# Patient Record
Sex: Male | Born: 1960 | Race: Black or African American | Hispanic: No | Marital: Single | Smoking: Current every day smoker
Health system: Southern US, Community
[De-identification: ages and names within clinical notes are randomized; demographics above are authoritative.]

## PROBLEM LIST (undated history)

## (undated) DIAGNOSIS — N289 Disorder of kidney and ureter, unspecified: Secondary | ICD-10-CM

## (undated) DIAGNOSIS — E119 Type 2 diabetes mellitus without complications: Secondary | ICD-10-CM

## (undated) DIAGNOSIS — J9690 Respiratory failure, unspecified, unspecified whether with hypoxia or hypercapnia: Secondary | ICD-10-CM

## (undated) DIAGNOSIS — E1340 Other specified diabetes mellitus with diabetic neuropathy, unspecified: Secondary | ICD-10-CM

## (undated) DIAGNOSIS — J189 Pneumonia, unspecified organism: Secondary | ICD-10-CM

## (undated) HISTORY — PX: SPINE SURGERY: SHX786

## (undated) HISTORY — DX: Other specified diabetes mellitus with diabetic neuropathy, unspecified: E13.40

---

## 2002-08-14 ENCOUNTER — Encounter: Payer: Self-pay | Admitting: Emergency Medicine

## 2002-08-14 ENCOUNTER — Emergency Department (HOSPITAL_COMMUNITY): Admission: EM | Admit: 2002-08-14 | Discharge: 2002-08-14 | Payer: Self-pay | Admitting: Emergency Medicine

## 2013-09-15 ENCOUNTER — Ambulatory Visit: Payer: Self-pay | Admitting: Internal Medicine

## 2013-09-20 ENCOUNTER — Ambulatory Visit: Payer: Self-pay

## 2013-11-04 ENCOUNTER — Encounter: Payer: Self-pay | Admitting: Internal Medicine

## 2013-11-04 ENCOUNTER — Ambulatory Visit: Payer: Self-pay | Attending: Internal Medicine | Admitting: Internal Medicine

## 2013-11-04 VITALS — BP 139/80 | HR 80 | Temp 98.5°F | Resp 16 | Ht 69.0 in | Wt 143.0 lb

## 2013-11-04 DIAGNOSIS — M545 Low back pain, unspecified: Secondary | ICD-10-CM | POA: Insufficient documentation

## 2013-11-04 DIAGNOSIS — M4120 Other idiopathic scoliosis, site unspecified: Secondary | ICD-10-CM | POA: Insufficient documentation

## 2013-11-04 LAB — CBC WITH DIFFERENTIAL/PLATELET
Basophils Absolute: 0 10*3/uL (ref 0.0–0.1)
Basophils Relative: 0 % (ref 0–1)
Eosinophils Absolute: 0.2 10*3/uL (ref 0.0–0.7)
Eosinophils Relative: 3 % (ref 0–5)
HCT: 47.1 % (ref 39.0–52.0)
Hemoglobin: 16 g/dL (ref 13.0–17.0)
Lymphocytes Relative: 25 % (ref 12–46)
Lymphs Abs: 1.5 10*3/uL (ref 0.7–4.0)
MCH: 29.5 pg (ref 26.0–34.0)
MCHC: 34 g/dL (ref 30.0–36.0)
MCV: 86.7 fL (ref 78.0–100.0)
Monocytes Absolute: 0.8 10*3/uL (ref 0.1–1.0)
Monocytes Relative: 13 % — ABNORMAL HIGH (ref 3–12)
Neutro Abs: 3.5 10*3/uL (ref 1.7–7.7)
Neutrophils Relative %: 59 % (ref 43–77)
Platelets: 311 10*3/uL (ref 150–400)
RBC: 5.43 MIL/uL (ref 4.22–5.81)
RDW: 13 % (ref 11.5–15.5)
WBC: 6 10*3/uL (ref 4.0–10.5)

## 2013-11-04 MED ORDER — IBUPROFEN 400 MG PO TABS: 400.0000 mg | ORAL_TABLET | Freq: Four times a day (QID) | ORAL | Status: DC | PRN

## 2013-11-04 MED ORDER — ACETAMINOPHEN-CODEINE #3 300-30 MG PO TABS: 1.0000 | ORAL_TABLET | ORAL | Status: DC | PRN

## 2013-11-04 NOTE — Progress Notes (Signed)
Pt is here to establish care. Pt reports having chronic pain in his lower back, right hip and in between his shoulder blades.

## 2013-11-04 NOTE — Patient Instructions (Signed)
Scoliosis  Scoliosis is the name given to a spine that curves sideways.Scoliosis can cause twisting of your shoulders, hips, chest, back, and rib cage.   CAUSES   The cause of scoliosis is not always known. It may be caused by a birth defect or by a disease that can cause muscular dysfunction and imbalance, such as cerebral palsy and muscular dystrophy.   RISK FACTORS  Having a disease that causes muscle disease or dysfunction.  SIGNS AND SYMPTOMS  Scoliosis often has no signs or symptoms.If they are present, they may include:   Unequal size of one body side compared to the other (asymmetry).   Visible curvature of the spine.   Pain. The pain may limit physical activity.   Shortness of breath.   Bowel or bladder issues.  DIAGNOSIS  A skilled health care provider will perform an evaluation. This will involve:   Taking your history.   Performing a physical examination.   Performing neurological exam to detect nerve or muscle function loss.   Range of motion studies on the spine.   X-rays.  An MRI may also be obtained.  TREATMENT   Treatment varies depending on the nature, extent, and severity of the disease. If the curvature is not great, you may need only observation. A brace may be used to prevent scoliosis from progressing. A brace may also be needed during growth spurts. Physical therapy may be of benefit. Surgery may be required.   HOME CARE INSTRUCTIONS    Your health care provider may suggest exercises to strengthen your muscles. Perform them as directed.   Ask your health care provider before participating in any sports.    If you have been prescribed an orthopedic brace, wear it as instructed by your health care provider.  SEEK MEDICAL CARE IF:  Your brace causes the skin to become sore (chafe) or is uncomfortable.   SEEK IMMEDIATE MEDICAL CARE IF:   You have back pain that is not relieved by the medicines prescribed by your health care provider.    Your legs feel weak or you lose function  in your legs.   You lose some bowel or bladder control.   Document Released: 09/22/2000 Document Revised: 07/16/2013 Document Reviewed: 06/02/2013  [employer] Patient Information 2014 [employer]

## 2013-11-04 NOTE — Progress Notes (Signed)
Patient ID: BACH ROCCHI, male   DOB: Mar 05, 1961, 53 y.o.   MRN: [id number] Patient Demographics  Allen Hernandez, is a 53 y.o. male  WUJ:[id number]  NFA:[id number]  DOB - 1960/12/19  CC:  Chief Complaint  Patient presents with  . Establish Care       HPI: Allen Hernandez is a 53 y.o. male here today to establish medical care. 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 He has no history of hypertension or diabetes. His mother had breast cancer, his father died of pancreatic cancer. He has a brother who has diabetes. He smokes cigarettes about half a pack per day, drinks alcohol occasionally. He is in the process of applying for disability and may soon go to court, his lawyer has advised that he has a primary care physician and because he has no insurance was referred to our clinic. Patient has No headache, No chest pain, No abdominal pain - No Nausea, No new weakness tingling or numbness, No Cough - SOB.  No Known Allergies History reviewed. No pertinent past medical history. No current outpatient prescriptions on file prior to visit.   No current facility-administered medications on file prior to visit.   Family History  Problem Relation Age of Onset  . Cancer Mother   . Cancer Father   . Diabetes Brother    History   Social History  . Marital Status: Single    Spouse Name: N/A    Number of Children: N/A  . Years of Education: N/A   Occupational History  . Not on file.   Social History Main Topics  . Smoking status: Heavy Tobacco Smoker -- 0.50 packs/day    Types: Cigarettes  . Smokeless tobacco: Not on file  . Alcohol Use: 0.5 oz/week    1 drink(s) per week  . Drug Use: No  . Sexual Activity: Yes    Other Topics Concern  . Not on file   Social History Narrative  . No narrative on file    Review of Systems: Constitutional: Negative for fever, chills, diaphoresis, activity change, appetite change and fatigue. HENT: Negative for ear pain, nosebleeds, congestion, facial swelling, rhinorrhea, neck pain, neck stiffness and ear discharge.  Eyes: Negative for pain, discharge, redness, itching and visual disturbance. Respiratory: Negative for cough, choking, chest tightness, shortness of breath, wheezing and stridor.  Cardiovascular: Negative for chest pain, palpitations and leg swelling. Gastrointestinal: Negative for abdominal distention. Genitourinary: Negative for dysuria, urgency, frequency, hematuria, flank pain, decreased urine volume, difficulty urinating and dyspareunia.  Musculoskeletal: Back pain  Neurological: Negative for dizziness, tremors, seizures, syncope, facial asymmetry, speech difficulty, weakness, light-headedness, numbness and headaches.  Hematological: Negative for adenopathy. Does not bruise/bleed easily. Psychiatric/Behavioral: Negative for hallucinations, behavioral problems, confusion, dysphoric mood, decreased concentration and agitation.    Objective:   Filed Vitals:   11/04/13 1149  BP: 139/80  Pulse: 80  Temp: 98.5 F (36.9 C)  Resp: 16    Physical Exam: Constitutional: Patient appears well-developed and well-nourished. No distress. HENT: Normocephalic, atraumatic, External right and left ear normal. Oropharynx is clear and moist.  Eyes: Conjunctivae and EOM are normal. PERRLA, no scleral icterus. Neck: Normal ROM. Neck supple. No JVD. No tracheal deviation. No thyromegaly. CVS: RRR, S1/S2 +, no murmurs,  no gallops, no carotid bruit.  Pulmonary: Effort and breath sounds normal, no stridor, rhonchi, wheezes, rales.  Abdominal: Soft. BS +, no distension, tenderness, rebound or guarding.  Musculoskeletal: Normal range of motion. No edema and no  tenderness. Kyphoscoliosis with healed surgical scar from cervical to sacral spine Lymphadenopathy: No lymphadenopathy noted, cervical, inguinal or axillary Neuro: Alert. Normal reflexes, muscle tone coordination. No cranial nerve deficit. Skin: Skin is warm and dry. No rash noted. Not diaphoretic. No erythema. No pallor. Psychiatric: Normal mood and affect. Behavior, judgment, thought content normal.  No results found for this basename: WBC, HGB, HCT, MCV, PLT   No results found for this basename: CREATININE, BUN, NA, K, CL, CO2    No results found for this basename: HGBA1C   Lipid Panel  No results found for this basename: chol, trig, hdl, cholhdl, vldl, ldlcalc       Assessment and plan:   1. Scoliosis (and kyphoscoliosis), idiopathic Status post surgery  2. Low back pain  - acetaminophen-codeine (TYLENOL #3) 300-30 MG per tablet; Take 1 tablet by mouth every 4 (four) hours as needed for moderate pain.  Dispense: 90 tablet; Refill: 0 - ibuprofen (ADVIL,MOTRIN) 400 MG tablet; Take 1 tablet (400 mg total) by mouth every 6 (six) hours as needed for fever.  Dispense: 60 tablet; Refill: 0 - CBC with Differential - COMPLETE METABOLIC PANEL WITH GFR - Urinalysis, Complete - Lipid panel  Patient was extensively counseled about smoking cessation  Follow up in 3 months or when necessary    The patient was given clear instructions to go to ER or return to medical center if symptoms don't improve, worsen or new problems develop. The patient verbalized understanding. The patient was told to call to get lab results if they haven't heard anything in the next week.     Jeanann LewandowskyJEGEDE, Maricela Kawahara, MD, MHA, Maxwell CaulFACP, FAAP [facility] [place], [place] [phone]   11/04/2013, 12:33 PM

## 2013-11-05 ENCOUNTER — Telehealth: Payer: Self-pay | Admitting: *Deleted

## 2013-11-05 LAB — COMPLETE METABOLIC PANEL WITH GFR
ALT: 15 U/L (ref 0–53)
AST: 13 U/L (ref 0–37)
Albumin: 4.3 g/dL (ref 3.5–5.2)
Alkaline Phosphatase: 71 U/L (ref 39–117)
BUN: 9 mg/dL (ref 6–23)
CO2: 26 mEq/L (ref 19–32)
Calcium: 9.3 mg/dL (ref 8.4–10.5)
Chloride: 101 mEq/L (ref 96–112)
Creat: 0.98 mg/dL (ref 0.50–1.35)
GFR, Est African American: >89 mL/min
GFR, Est Non African American: 89 mL/min
Glucose, Bld: 283 mg/dL — ABNORMAL HIGH (ref 70–99)
Potassium: 4.1 mEq/L (ref 3.5–5.3)
Sodium: 137 mEq/L (ref 135–145)
Total Bilirubin: 0.4 mg/dL (ref 0.3–1.2)
Total Protein: 6.9 g/dL (ref 6.0–8.3)

## 2013-11-05 LAB — LIPID PANEL
Cholesterol: 150 mg/dL (ref 0–200)
HDL: 50 mg/dL (ref >39)
LDL Cholesterol: 87 mg/dL (ref 0–99)
Total CHOL/HDL Ratio: 3 Ratio
Triglycerides: 63 mg/dL (ref <150)
VLDL: 13 mg/dL (ref 0–40)

## 2013-11-05 LAB — URINALYSIS, COMPLETE
Bacteria, UA: NONE SEEN
Casts: NONE SEEN
Crystals: NONE SEEN
Glucose, UA: >1000 mg/dL — AB
Hgb urine dipstick: NEGATIVE
Ketones, ur: NEGATIVE mg/dL
Leukocytes, UA: NEGATIVE
Nitrite: NEGATIVE
Protein, ur: NEGATIVE mg/dL
Specific Gravity, Urine: >1.03 — ABNORMAL HIGH (ref 1.005–1.030)
Squamous Epithelial / LPF: NONE SEEN
Urobilinogen, UA: 1 mg/dL (ref 0.0–1.0)
pH: 5 (ref 5.0–8.0)

## 2013-11-05 NOTE — Telephone Encounter (Signed)
Left message with mother she will have him call me tomorrow.

## 2013-11-05 NOTE — Telephone Encounter (Signed)
Message copied by Raynelle CharyWINFREE, Malory Spurr R on Wed Nov 05, 2013  6:05 PM ------      Message from: Jeanann LewandowskyJEGEDE, OLUGBEMIGA E      Created: Wed Nov 05, 2013  9:32 AM       Please call patient to inform him that his laboratory tests results shows a very high blood sugar level and large quantity of glucose in his urine, this suggests that most likely diabetic. We need to see him in the clinic today for a repeat blood test to confirm these diagnoses and to start medication appropriately. Please inform him that his cholesterol levels are within normal limit ------

## 2013-11-18 ENCOUNTER — Ambulatory Visit: Payer: Self-pay

## 2013-12-12 ENCOUNTER — Ambulatory Visit: Payer: Self-pay

## 2013-12-25 ENCOUNTER — Ambulatory Visit: Payer: Self-pay

## 2014-01-29 ENCOUNTER — Ambulatory Visit: Payer: Self-pay | Attending: Internal Medicine | Admitting: Internal Medicine

## 2014-01-29 VITALS — BP 124/83 | HR 68 | Temp 98.2°F | Resp 16

## 2014-01-29 DIAGNOSIS — G8929 Other chronic pain: Secondary | ICD-10-CM | POA: Insufficient documentation

## 2014-01-29 DIAGNOSIS — M549 Dorsalgia, unspecified: Secondary | ICD-10-CM | POA: Insufficient documentation

## 2014-01-29 DIAGNOSIS — M4120 Other idiopathic scoliosis, site unspecified: Secondary | ICD-10-CM | POA: Insufficient documentation

## 2014-01-29 MED ORDER — OXYCODONE-ACETAMINOPHEN 5-325 MG PO TABS: 1.0000 | ORAL_TABLET | ORAL | Status: DC | PRN

## 2014-01-29 NOTE — Progress Notes (Signed)
Patient here for his chronic back pain Needs a referral to pain management

## 2014-01-29 NOTE — Progress Notes (Signed)
Patient ID: Allen Hernandez, male   DOB: 10/28/1960, 53 y.o.   MRN: [id number]  Please note that patient was checking on referral for pain clinic. Will look into the reasons for delay. I have not seen the actual order for pain clinic referral so I will put this request in today. Patient has chronic scoliosis so we'll need to see pain specialist as soon as possible.  Debbora PrestoMAGICK-[name], MD  [facility] Pager [phone]  If 7PM-7AM, please contact night-coverage [email or web address] Password [email or web address]

## 2014-02-03 ENCOUNTER — Ambulatory Visit: Payer: Self-pay

## 2014-02-12 ENCOUNTER — Ambulatory Visit: Payer: Self-pay

## 2014-03-04 ENCOUNTER — Ambulatory Visit: Payer: Self-pay

## 2014-03-13 ENCOUNTER — Telehealth: Payer: Self-pay | Admitting: Internal Medicine

## 2014-03-13 ENCOUNTER — Telehealth: Payer: Self-pay | Admitting: Emergency Medicine

## 2014-03-13 NOTE — Telephone Encounter (Signed)
Responded to pt message of blood sugar 233 with syncope last night. Pt was seen by EMT at home without treatment Pt states he is feeling much better today. Pt is not diagnosed with Diabetes. Informed pt he needs to come to the clinic Monday for CBG,A1c and blood work. Asher Muir and Dr. Hyman Hopes will see pt as [name], RN

## 2014-03-13 NOTE — Telephone Encounter (Signed)
Pt not feeling well. Passed out last night but did not want to go to ED. Pt has sickle cell anemia, and had a blood sugar level of 233. Please f/u with pt.

## 2014-03-16 ENCOUNTER — Other Ambulatory Visit: Payer: Self-pay

## 2014-03-16 ENCOUNTER — Ambulatory Visit: Payer: Self-pay | Attending: Internal Medicine | Admitting: Internal Medicine

## 2014-03-16 VITALS — BP 119/83 | HR 74 | Temp 98.3°F | Resp 14 | Ht 70.0 in | Wt 140.0 lb

## 2014-03-16 DIAGNOSIS — E119 Type 2 diabetes mellitus without complications: Secondary | ICD-10-CM

## 2014-03-16 DIAGNOSIS — Z131 Encounter for screening for diabetes mellitus: Secondary | ICD-10-CM

## 2014-03-16 LAB — COMPLETE METABOLIC PANEL WITH GFR
ALT: 16 U/L (ref 0–53)
AST: 13 U/L (ref 0–37)
Albumin: 4 g/dL (ref 3.5–5.2)
Alkaline Phosphatase: 73 U/L (ref 39–117)
BILIRUBIN TOTAL: 0.6 mg/dL (ref 0.2–1.2)
BUN: 9 mg/dL (ref 6–23)
CHLORIDE: 104 meq/L (ref 96–112)
CO2: 23 mEq/L (ref 19–32)
CREATININE: 0.85 mg/dL (ref 0.50–1.35)
Calcium: 9.4 mg/dL (ref 8.4–10.5)
GFR, Est African American: >89 mL/min
GLUCOSE: 162 mg/dL — AB (ref 70–99)
POTASSIUM: 4.2 meq/L (ref 3.5–5.3)
Sodium: 137 mEq/L (ref 135–145)
TOTAL PROTEIN: 6.7 g/dL (ref 6.0–8.3)

## 2014-03-16 LAB — GLUCOSE, POCT (MANUAL RESULT ENTRY)
POC Glucose: 282 mg/dl — AB (ref 70–99)
POC Glucose: 234 mg/dl — AB (ref 70–99)

## 2014-03-16 LAB — TSH: TSH: 0.686 u[IU]/mL (ref 0.350–4.500)

## 2014-03-16 LAB — POCT GLYCOSYLATED HEMOGLOBIN (HGB A1C): HEMOGLOBIN A1C: 9.6

## 2014-03-16 MED ORDER — INSULIN ASPART 100 UNIT/ML ~~LOC~~ SOLN
10.0000 [IU] | Freq: Once | SUBCUTANEOUS | Status: AC
  Administered 2014-03-16: 10 [IU] via SUBCUTANEOUS

## 2014-03-16 MED ORDER — METFORMIN HCL 500 MG PO TABS: 500.0000 mg | ORAL_TABLET | Freq: Two times a day (BID) | ORAL | Status: DC

## 2014-03-16 NOTE — Progress Notes (Signed)
Patient ID: Allen Hernandez, male   DOB: 02-13-1961, 53 y.o.   MRN: [id number]   Allen BirchwoodDamon Haston, is a 53 y.o. male  [id number]:[id number]  NFA:[id number]  DOB - 02-13-1961  CC:  Chief Complaint  Patient presents with  . Diabetes check       HPI: Allen Hernandez is a 53 y.o. male here today to establish medical care. Patient was called in to the clinic today after being lost to followup for hyperglycemia and glycosuria. His blood sugar today is very high and his hemoglobin A1c confirms that patient is diabetic. He has positive family history of diabetes. He is not on medication currently, he has no significant complaint. No blurry vision, no headache. Patient has no personal history of hypertension. He smokes cigarettes about half a pack per day and drinks alcohol occasionally.  Patient has No headache, No chest pain, No abdominal pain - No Nausea, No new weakness tingling or numbness, No Cough - SOB.  No Known Allergies No past medical history on file. Current Outpatient Prescriptions on File Prior to Visit  Medication Sig Dispense Refill  . acetaminophen-codeine (TYLENOL #3) 300-30 MG per tablet Take 1 tablet by mouth every 4 (four) hours as needed for moderate pain.  90 tablet  0  . ibuprofen (ADVIL,MOTRIN) 400 MG tablet Take 1 tablet (400 mg total) by mouth every 6 (six) hours as needed for fever.  60 tablet  0  . oxyCODONE-acetaminophen (ROXICET) 5-325 MG per tablet Take 1 tablet by mouth every 4 (four) hours as needed for severe pain.  65 tablet  0   No current facility-administered medications on file prior to visit.   Family History  Problem Relation Age of Onset  . Cancer Mother   . Cancer Father   . Diabetes Brother    History   Social History  . Marital Status: Single    Spouse Name: N/A    Number of Children: N/A  . Years of Education: N/A   Occupational History  . Not on file.   Social History Main Topics  . Smoking status: Heavy Tobacco Smoker -- 0.50 packs/day    Types:  Cigarettes  . Smokeless tobacco: Not on file  . Alcohol Use: 0.5 oz/week    1 drink(s) per week  . Drug Use: No  . Sexual Activity: Yes   Other Topics Concern  . Not on file   Social History Narrative  . No narrative on file    Review of Systems: Constitutional: Negative for fever, chills, diaphoresis, activity change, appetite change and fatigue. HENT: Negative for ear pain, nosebleeds, congestion, facial swelling, rhinorrhea, neck pain, neck stiffness and ear discharge.  Eyes: Negative for pain, discharge, redness, itching and visual disturbance. Respiratory: Negative for cough, choking, chest tightness, shortness of breath, wheezing and stridor.  Cardiovascular: Negative for chest pain, palpitations and leg swelling. Gastrointestinal: Negative for abdominal distention. Genitourinary: Negative for dysuria, urgency, frequency, hematuria, flank pain, decreased urine volume, difficulty urinating and dyspareunia.  Musculoskeletal: Negative for back pain, joint swelling, arthralgia and gait problem. Neurological: Negative for dizziness, tremors, seizures, syncope, facial asymmetry, speech difficulty, weakness, light-headedness, numbness and headaches.  Hematological: Negative for adenopathy. Does not bruise/bleed easily. Psychiatric/Behavioral: Negative for hallucinations, behavioral problems, confusion, dysphoric mood, decreased concentration and agitation.    Objective:   Filed Vitals:   03/16/14 1125  BP: 119/83  Pulse: 74  Temp: 98.3 F (36.8 C)  Resp: 14    Physical Exam: Constitutional: Patient appears well-developed and well-nourished. No  distress. HENT: Normocephalic, atraumatic, External right and left ear normal. Oropharynx is clear and moist.  Eyes: Conjunctivae and EOM are normal. PERRLA, no scleral icterus. Neck: Normal ROM. Neck supple. No JVD. No tracheal deviation. No thyromegaly. CVS: RRR, S1/S2 +, no murmurs, no gallops, no carotid bruit.  Pulmonary: Effort  and breath sounds normal, no stridor, rhonchi, wheezes, rales.  Abdominal: Soft. BS +, no distension, tenderness, rebound or guarding.  Musculoskeletal: Scoliosis. Normal range of motion. No edema and no tenderness.  Lymphadenopathy: No lymphadenopathy noted, cervical, inguinal or axillary Neuro: Alert. Normal reflexes, muscle tone coordination. No cranial nerve deficit. Skin: Skin is warm and dry. No rash noted. Not diaphoretic. No erythema. No pallor. Psychiatric: Normal mood and affect. Behavior, judgment, thought content normal.  Lab Results  Component Value Date   WBC 6.0 11/04/2013   HGB 16.0 11/04/2013   HCT 47.1 11/04/2013   MCV 86.7 11/04/2013   PLT 311 11/04/2013   Lab Results  Component Value Date   CREATININE 0.98 11/04/2013   BUN 9 11/04/2013   NA 137 11/04/2013   K 4.1 11/04/2013   CL 101 11/04/2013   CO2 26 11/04/2013    Lab Results  Component Value Date   HGBA1C 9.6 03/16/2014   Lipid Panel     Component Value Date/Time   CHOL 150 11/04/2013 1235   TRIG 63 11/04/2013 1235   HDL 50 11/04/2013 1235   CHOLHDL 3.0 11/04/2013 1235   VLDL 13 11/04/2013 1235   LDLCALC 87 11/04/2013 1235       Assessment and plan:   1. Newly diagnosed diabetes mellitus  - Glucose (CBG) - POCT A1C was 9.6% today  - Microalbumin/Creatinine Ratio, Urine - Microalbumin, urine - Hemoglobinopathy evaluation - COMPLETE METABOLIC PANEL WITH GFR - TSH  Patient has been provided with diabetic education Patient was extensively counseled on smoking cessation Patient was counseled on nutrition and exercise Ambulatory referral to diabetic class  2. Hyperglycemia  - insulin aspart (novoLOG) injection 10 Units; Inject 0.1 mLs (10 Units total) into the skin once.   Return in about 2 weeks (around 03/30/2014), or if symptoms worsen or fail to improve, for BP Check, Nurse Visit.  The patient was given clear instructions to go to ER or return to medical center if symptoms don't improve, worsen or  new problems develop. The patient verbalized understanding. The patient was told to call to get lab results if they haven't heard anything in the next week.     This note has been created with Education officer, environmental. Any transcriptional errors are unintentional.    Jeanann Lewandowsky, MD, MHA, Maxwell Caul [facility] [place], [place] [phone]   03/16/2014, 1:05 PM

## 2014-03-16 NOTE — Progress Notes (Signed)
Patient here for blood work to test for diabetes. Patient states last week he had syncopal episode. EMS checked his blood sugar and he was 233.

## 2014-03-16 NOTE — Patient Instructions (Addendum)
Diabetes and Exercise Exercising regularly is important. It is not just about losing weight. It has many health benefits, such as:  Improving your overall fitness, flexibility, and endurance.  Increasing your bone density.  Helping with weight control.  Decreasing your body fat.  Increasing your muscle strength.  Reducing stress and tension.  Improving your overall health. People with diabetes who exercise gain additional benefits because exercise:  Reduces appetite.  Improves the body's use of blood sugar (glucose).  Helps lower or control blood glucose.  Decreases blood pressure.  Helps control blood lipids (such as cholesterol and triglycerides).  Improves the body's use of the hormone insulin by:  Increasing the body's insulin sensitivity.  Reducing the body's insulin needs.  Decreases the risk for heart disease because exercising:  Lowers cholesterol and triglycerides levels.  Increases the levels of good cholesterol (such as high-density lipoproteins [HDL]) in the body.  Lowers blood glucose levels. YOUR ACTIVITY PLAN  Choose an activity that you enjoy and set realistic goals. Your health care provider or diabetes educator can help you make an activity plan that works for you. You can break activities into 2 or 3 sessions throughout the day. Doing so is as good as one long session. Exercise ideas include:  Taking the dog for a walk.  Taking the stairs instead of the elevator.  Dancing to your favorite song.  Doing your favorite exercise with a friend. RECOMMENDATIONS FOR EXERCISING WITH TYPE 1 OR TYPE 2 DIABETES   Check your blood glucose before exercising. If blood glucose levels are greater than 240 mg/dL, check for urine ketones. Do not exercise if ketones are present.  Avoid injecting insulin into areas of the body that are going to be exercised. For example, avoid injecting insulin into:  The arms when playing tennis.  The legs when  jogging.  Keep a record of:  Food intake before and after you exercise.  Expected peak times of insulin action.  Blood glucose levels before and after you exercise.  The type and amount of exercise you have done.  Review your records with your health care provider. Your health care provider will help you to develop guidelines for adjusting food intake and insulin amounts before and after exercising.  If you take insulin or oral hypoglycemic agents, watch for signs and symptoms of hypoglycemia. They include:  Dizziness.  Shaking.  Sweating.  Chills.  Confusion.  Drink plenty of water while you exercise to prevent dehydration or heat stroke. Body water is lost during exercise and must be replaced.  Talk to your health care provider before starting an exercise program to make sure it is safe for you. Remember, almost any type of activity is better than none. Document Released: 12/16/2003 Document Revised: 05/28/2013 Document Reviewed: 03/04/2013 ExitCare Patient Information 2014 [employer]. Diabetes Meal Planning Guide The diabetes meal planning guide is a tool to help you plan your meals and snacks. It is important for people with diabetes to manage their blood glucose (sugar) levels. Choosing the right foods and the right amounts throughout your day will help control your blood glucose. Eating right can even help you improve your blood pressure and reach or maintain a healthy weight. CARBOHYDRATE COUNTING MADE EASY When you eat carbohydrates, they turn to sugar. This raises your blood glucose level. Counting carbohydrates can help you control this level so you feel better. When you plan your meals by counting carbohydrates, you can have more flexibility in what you eat and balance your medicine   with your food intake. Carbohydrate counting simply means adding up the total amount of carbohydrate grams in your meals and snacks. Try to eat about the same amount at each meal. Foods  with carbohydrates are listed below. Each portion below is 1 carbohydrate serving or 15 grams of carbohydrates. Ask your dietician how many grams of carbohydrates you should eat at each meal or snack. Grains and Starches  1 slice bread.   English muffin or hotdog/hamburger bun.   cup cold cereal (unsweetened).   cup cooked pasta or rice.   cup starchy vegetables (corn, potatoes, peas, beans, winter squash).  1 tortilla (6 inches).   bagel.  1 waffle or pancake (size of a CD).   cup cooked cereal.  4 to 6 small crackers. *Whole grain is recommended. Fruit  1 cup fresh unsweetened berries, melon, papaya, pineapple.  1 small fresh fruit.   banana or mango.   cup fruit juice (4 oz unsweetened).   cup canned fruit in natural juice or water.  2 tbs dried fruit.  12 to 15 grapes or cherries. Milk and Yogurt  1 cup fat-free or 1% milk.  1 cup soy milk.  6 oz light yogurt with sugar-free sweetener.  6 oz low-fat soy yogurt.  6 oz plain yogurt. Vegetables  1 cup raw or  cup cooked is counted as 0 carbohydrates or a "free" food.  If you eat 3 or more servings at 1 meal, count them as 1 carbohydrate serving. Other Carbohydrates   oz chips or pretzels.   cup ice cream or frozen yogurt.   cup sherbet or sorbet.  2 inch square cake, no frosting.  1 tbs honey, sugar, jam, jelly, or syrup.  2 small cookies.  3 squares of graham crackers.  3 cups popcorn.  6 crackers.  1 cup broth-based soup.  Count 1 cup casserole or other mixed foods as 2 carbohydrate servings.  Foods with less than 20 calories in a serving may be counted as 0 carbohydrates or a "free" food. You may want to purchase a book or computer software that lists the carbohydrate gram counts of different foods. In addition, the nutrition facts panel on the labels of the foods you eat are a good source of this information. The label will tell you how big the serving size is and the  total number of carbohydrate grams you will be eating per serving. Divide this number by 15 to obtain the number of carbohydrate servings in a portion. Remember, 1 carbohydrate serving equals 15 grams of carbohydrate. SERVING SIZES Measuring foods and serving sizes helps you make sure you are getting the right amount of food. The list below tells how big or small some common serving sizes are.  1 oz.........4 stacked dice.  3 oz.........Deck of cards.  1 tsp........Tip of little finger.  1 tbs........Thumb.  2 tbs........Golf ball.   cup.......Half of a fist.  1 cup........A fist. SAMPLE DIABETES MEAL PLAN Below is a sample meal plan that includes foods from the grain and starches, dairy, vegetable, fruit, and meat groups. A dietician can individualize a meal plan to fit your calorie needs and tell you the number of servings needed from each food group. However, controlling the total amount of carbohydrates in your meal or snack is more important than making sure you include all of the food groups at every meal. You may interchange carbohydrate containing foods (dairy, starches, and fruits). The meal plan below is an example of a 2000 calorie diet   using carbohydrate counting. This meal plan has 17 carbohydrate servings. Breakfast  1 cup oatmeal (2 carb servings).   cup light yogurt (1 carb serving).  1 cup blueberries (1 carb serving).   cup almonds. Snack  1 large apple (2 carb servings).  1 low-fat string cheese stick. Lunch  Chicken breast salad.  1 cup spinach.   cup chopped tomatoes.  2 oz chicken breast, sliced.  2 tbs low-fat Italian dressing.  12 whole-wheat crackers (2 carb servings).  12 to 15 grapes (1 carb serving).  1 cup low-fat milk (1 carb serving). Snack  1 cup carrots.   cup hummus (1 carb serving). Dinner  3 oz broiled salmon.  1 cup brown rice (3 carb servings). Snack  1  cups steamed broccoli (1 carb serving) drizzled with 1  tsp olive oil and lemon juice.  1 cup light pudding (2 carb servings). DIABETES MEAL PLANNING WORKSHEET Your dietician can use this worksheet to help you decide how many servings of foods and what types of foods are right for you.  BREAKFAST Food Group and Servings / Carb Servings Grain/Starches __________________________________ Dairy __________________________________________ Vegetable ______________________________________ Fruit ___________________________________________ Meat __________________________________________ Fat ____________________________________________ LUNCH Food Group and Servings / Carb Servings Grain/Starches ___________________________________ Dairy ___________________________________________ Fruit ____________________________________________ Meat ___________________________________________ Fat _____________________________________________ DINNER Food Group and Servings / Carb Servings Grain/Starches ___________________________________ Dairy ___________________________________________ Fruit ____________________________________________ Meat ___________________________________________ Fat _____________________________________________ SNACKS Food Group and Servings / Carb Servings Grain/Starches ___________________________________ Dairy ___________________________________________ Vegetable _______________________________________ Fruit ____________________________________________ Meat ___________________________________________ Fat _____________________________________________ DAILY TOTALS Starches _________________________ Vegetable ________________________ Fruit ____________________________ Dairy ____________________________ Meat ____________________________ Fat ______________________________ Document Released: 06/22/2005 Document Revised: 12/18/2011 Document Reviewed: 05/03/2009 ExitCare Patient Information 2014 [employer].  

## 2014-03-17 LAB — MICROALBUMIN / CREATININE URINE RATIO
Creatinine, Urine: 399.4 mg/dL
MICROALB/CREAT RATIO: 2.2 mg/g (ref 0.0–30.0)
Microalb, Ur: 0.88 mg/dL (ref 0.00–1.89)

## 2014-03-18 LAB — HEMOGLOBINOPATHY EVALUATION
HEMOGLOBIN OTHER: 0 %
Hgb A2 Quant: 2.6 % (ref 2.2–3.2)
Hgb A: 97.4 % (ref 96.8–97.8)
Hgb F Quant: 0 % (ref 0.0–2.0)
Hgb S Quant: 0 %

## 2014-03-30 ENCOUNTER — Ambulatory Visit: Payer: Self-pay | Attending: Internal Medicine

## 2014-03-30 NOTE — Progress Notes (Signed)
Pt came in for blood sugar recheck s/p new diagnosis Diabetes on Metformin 500mg  BID Pt never picked up medication in pharmacy. Instructed pt to pick medication up and return in 2 weeks for recheck CBG

## 2014-04-07 ENCOUNTER — Telehealth: Payer: Self-pay | Admitting: Internal Medicine

## 2014-04-07 NOTE — Telephone Encounter (Signed)
Pt calling regarding med refill for pain meds, last appt was in April. Please f/u with pt.

## 2014-04-08 ENCOUNTER — Telehealth: Payer: Self-pay | Admitting: Emergency Medicine

## 2014-04-08 NOTE — Telephone Encounter (Signed)
Pt requesting refill of Tylenol #3. Please f/u

## 2014-04-13 ENCOUNTER — Ambulatory Visit: Payer: Self-pay | Attending: Internal Medicine | Admitting: *Deleted

## 2014-04-13 VITALS — BP 122/83 | HR 81 | Temp 98.5°F | Resp 16

## 2014-04-13 DIAGNOSIS — M545 Low back pain, unspecified: Secondary | ICD-10-CM

## 2014-04-13 DIAGNOSIS — E119 Type 2 diabetes mellitus without complications: Secondary | ICD-10-CM

## 2014-04-13 LAB — GLUCOSE, POCT (MANUAL RESULT ENTRY)
POC GLUCOSE: 310 mg/dL — AB (ref 70–99)
POC GLUCOSE: 329 mg/dL — AB (ref 70–99)

## 2014-04-13 MED ORDER — INSULIN ASPART 100 UNIT/ML ~~LOC~~ SOLN: 20.0000 [IU] | Freq: Once | SUBCUTANEOUS | Status: DC

## 2014-04-13 MED ORDER — ACETAMINOPHEN-CODEINE #3 300-30 MG PO TABS: 1.0000 | ORAL_TABLET | Freq: Three times a day (TID) | ORAL | Status: DC | PRN

## 2014-04-13 NOTE — Patient Instructions (Signed)
Diabetes Mellitus and Food It is important for you to manage your blood sugar (glucose) level. Your blood glucose level can be greatly affected by what you eat. Eating healthier foods in the appropriate amounts throughout the day at about the same time each day will help you control your blood glucose level. It can also help slow or prevent worsening of your diabetes mellitus. Healthy eating may even help you improve the level of your blood pressure and reach or maintain a healthy weight.  HOW CAN FOOD AFFECT ME? Carbohydrates Carbohydrates affect your blood glucose level more than any other type of food. Your dietitian will help you determine how many carbohydrates to eat at each meal and teach you how to count carbohydrates. Counting carbohydrates is important to keep your blood glucose at a healthy level, especially if you are using insulin or taking certain medicines for diabetes mellitus. Alcohol Alcohol can cause sudden decreases in blood glucose (hypoglycemia), especially if you use insulin or take certain medicines for diabetes mellitus. Hypoglycemia can be a life-threatening condition. Symptoms of hypoglycemia (sleepiness, dizziness, and disorientation) are similar to symptoms of having too much alcohol.  If your health care provider has given you approval to drink alcohol, do so in moderation and use the following guidelines:  Women should not have more than one drink per day, and men should not have more than two drinks per day. One drink is equal to:  12 oz of beer.  5 oz of wine.  1 oz of hard liquor.  Do not drink on an empty stomach.  Keep yourself hydrated. Have water, diet soda, or unsweetened iced tea.  Regular soda, juice, and other mixers might contain a lot of carbohydrates and should be counted. WHAT FOODS ARE NOT RECOMMENDED? As you make food choices, it is important to remember that all foods are not the same. Some foods have fewer nutrients per serving than other  foods, even though they might have the same number of calories or carbohydrates. It is difficult to get your body what it needs when you eat foods with fewer nutrients. Examples of foods that you should avoid that are high in calories and carbohydrates but low in nutrients include:  Trans fats (most processed foods list trans fats on the Nutrition Facts label).  Regular soda.  Juice.  Candy.  Sweets, such as cake, pie, doughnuts, and cookies.  Fried foods. WHAT FOODS CAN I EAT? Have nutrient-rich foods, which will nourish your body and keep you healthy. The food you should eat also will depend on several factors, including:  The calories you need.  The medicines you take.  Your weight.  Your blood glucose level.  Your blood pressure level.  Your cholesterol level. You also should eat a variety of foods, including:  Protein, such as meat, poultry, fish, tofu, nuts, and seeds (lean animal proteins are best).  Fruits.  Vegetables.  Dairy products, such as milk, cheese, and yogurt (low fat is best).  Breads, grains, pasta, cereal, rice, and beans.  Fats such as olive oil, trans fat-free margarine, canola oil, avocado, and olives. DOES EVERYONE WITH DIABETES MELLITUS HAVE THE SAME MEAL PLAN? Because every person with diabetes mellitus is different, there is not one meal plan that works for everyone. It is very important that you meet with a dietitian who will help you create a meal plan that is just right for you. Document Released: 06/22/2005 Document Revised: 09/30/2013 Document Reviewed: 08/22/2013 ExitCare Patient Information 2015 [employer]. This   information is not intended to replace advice given to you by your health care provider. Make sure you discuss any questions you have with your health care provider.  

## 2014-04-13 NOTE — Progress Notes (Signed)
Patient here for CBG check. Patient has been taking medication as prescribed but diet is not controlled. Patient CBG 376. 10 units of Novolog administered per protocol. CBG recheck was 310. Patient educated about diet.

## 2014-04-21 ENCOUNTER — Telehealth: Payer: Self-pay | Admitting: Emergency Medicine

## 2014-04-21 ENCOUNTER — Telehealth: Payer: Self-pay | Admitting: Internal Medicine

## 2014-04-21 NOTE — Telephone Encounter (Signed)
Spoke to pt regarding clarification of scheduled office visit. Pt has appt for 05/11/14. No other appt scheduled

## 2014-04-21 NOTE — Telephone Encounter (Signed)
Pt had lab visit on 04/13/14 and says he was told to come back for another visit apart from the f/u appt on 05/11/14.  Please f/u with pt.

## 2014-05-11 ENCOUNTER — Ambulatory Visit: Payer: Self-pay | Admitting: Internal Medicine

## 2014-05-26 ENCOUNTER — Ambulatory Visit: Payer: Self-pay | Admitting: Internal Medicine

## 2014-06-16 ENCOUNTER — Telehealth: Payer: Self-pay | Admitting: Internal Medicine

## 2014-06-16 NOTE — Telephone Encounter (Signed)
Pt. Calling to get a refill on acetaminophen-codeine (TYLENOL #3) 300-30 MG per tablet, pt. Has a Dr.'s appt on 06/22/2014, but would like enough medication to last him until his appt. Please f/u with pt.

## 2014-06-22 ENCOUNTER — Ambulatory Visit: Payer: Self-pay | Admitting: Internal Medicine

## 2014-07-13 ENCOUNTER — Encounter: Payer: Self-pay | Admitting: Internal Medicine

## 2014-07-13 ENCOUNTER — Ambulatory Visit: Payer: Medicaid Other | Attending: Internal Medicine | Admitting: Internal Medicine

## 2014-07-13 VITALS — BP 106/68 | HR 83 | Temp 98.1°F | Resp 16 | Ht 70.0 in | Wt 139.0 lb

## 2014-07-13 DIAGNOSIS — E119 Type 2 diabetes mellitus without complications: Secondary | ICD-10-CM | POA: Insufficient documentation

## 2014-07-13 DIAGNOSIS — M419 Scoliosis, unspecified: Secondary | ICD-10-CM | POA: Insufficient documentation

## 2014-07-13 DIAGNOSIS — M549 Dorsalgia, unspecified: Secondary | ICD-10-CM | POA: Insufficient documentation

## 2014-07-13 DIAGNOSIS — M40203 Unspecified kyphosis, cervicothoracic region: Secondary | ICD-10-CM | POA: Insufficient documentation

## 2014-07-13 DIAGNOSIS — G8929 Other chronic pain: Secondary | ICD-10-CM | POA: Insufficient documentation

## 2014-07-13 DIAGNOSIS — F17200 Nicotine dependence, unspecified, uncomplicated: Secondary | ICD-10-CM | POA: Insufficient documentation

## 2014-07-13 DIAGNOSIS — M4120 Other idiopathic scoliosis, site unspecified: Secondary | ICD-10-CM | POA: Diagnosis not present

## 2014-07-13 DIAGNOSIS — E1140 Type 2 diabetes mellitus with diabetic neuropathy, unspecified: Secondary | ICD-10-CM | POA: Insufficient documentation

## 2014-07-13 LAB — POCT GLYCOSYLATED HEMOGLOBIN (HGB A1C): Hemoglobin A1C: 9.5

## 2014-07-13 LAB — GLUCOSE, POCT (MANUAL RESULT ENTRY)
POC GLUCOSE: 236 mg/dL — AB (ref 70–99)
POC Glucose: 371 mg/dl — AB (ref 70–99)

## 2014-07-13 MED ORDER — METFORMIN HCL 500 MG PO TABS: 500.0000 mg | ORAL_TABLET | Freq: Two times a day (BID) | ORAL | Status: DC

## 2014-07-13 MED ORDER — CYCLOBENZAPRINE HCL 10 MG PO TABS: 10.0000 mg | ORAL_TABLET | Freq: Three times a day (TID) | ORAL | Status: DC | PRN

## 2014-07-13 MED ORDER — INSULIN ASPART 100 UNIT/ML ~~LOC~~ SOLN
20.0000 [IU] | Freq: Once | SUBCUTANEOUS | Status: AC
  Administered 2014-07-13: 20 [IU] via SUBCUTANEOUS

## 2014-07-13 MED ORDER — DAPAGLIFLOZIN PROPANEDIOL 10 MG PO TABS: 10.0000 mg | ORAL_TABLET | Freq: Every day | ORAL | Status: DC

## 2014-07-13 MED ORDER — GLUCOSE BLOOD VI STRP: ORAL_STRIP | Status: DC

## 2014-07-13 MED ORDER — TRAMADOL HCL 50 MG PO TABS: 50.0000 mg | ORAL_TABLET | Freq: Two times a day (BID) | ORAL | Status: DC | PRN

## 2014-07-13 NOTE — Progress Notes (Signed)
Pt is here following up on his diabetes and chronic lower back pain.

## 2014-07-13 NOTE — Progress Notes (Signed)
Patient ID: Allen Hernandez, male   DOB: 29-Jan-1961, 53 y.o.   MRN: [id number]   [name], is a 53 y.o. male  [id number]  [id number]  DOB - [birth year]  Chief Complaint  Patient presents with  . Follow-up        Subjective:   Allen Hernandez is a 53 y.o. male here today for a follow up visit on his chronic back pain and DM.  PMH significant for scoliosis and kyphosis with surgical interventions and recently diagnosed DM.  Since his spinal surgery 15 years ago, he has had issues with back pain.  Over the last 2-3 years the pain has intensified and has inhibited daily functioning.  He has been referred to a pain clinic for management but does not have an orange card so he has not been able to be seen.  He has a financial appt 10/9 to apply for the orange card.  Previously he has taken Tylenol #3 for the pain. This is no longer effective and he is requesting something stronger.  The pain is located in his lower back, is constant but worse in the morning limiting his ability to get out of bed.  He currently rates his pain as an 8 out of 10 and describes it as stabbing.    Since his diagnosis of DM, Allen Hernandez has been compliant with his metformin.  He does not have a glucometer and has not been able to check his blood sugars.  Last week he reports an issues of feeling dizzy and that his speech was slurred.  His mother was with him and gave him a glass of orange juice.  The symptoms resolved almost instantaneously.  He denies polyuria, polydipsia, blurred vision.  He endorses poor dietary choices and states that he "needs to do better."  Diet recall of breakfast was BBQ chicken, rice, collards, and Koolaid.   Currently, Allen Hernandez smokes 1/3 ppd and occasionally consumes alcohol.  He states that he would like to stop smoking but does not have a quit date established.    No problems updated.  ALLERGIES: No Known Allergies  PAST MEDICAL HISTORY: History reviewed. No pertinent past medical  history.  MEDICATIONS AT HOME: Prior to Admission medications   Medication Sig Start Date End Date Taking? Authorizing Provider  acetaminophen-codeine (TYLENOL #3) 300-30 MG per tablet Take 1 tablet by mouth every 8 (eight) hours as needed for moderate pain. 04/13/14  Yes Quentin Angst, MD  ibuprofen (ADVIL,MOTRIN) 400 MG tablet Take 1 tablet (400 mg total) by mouth every 6 (six) hours as needed for fever. 11/04/13  Yes Quentin Angst, MD  metFORMIN (GLUCOPHAGE) 500 MG tablet Take 1 tablet (500 mg total) by mouth 2 (two) times daily with a meal. 03/16/14  Yes Quentin Angst, MD  oxyCODONE-acetaminophen (ROXICET) 5-325 MG per tablet Take 1 tablet by mouth every 4 (four) hours as needed for severe pain. 01/29/14  Yes Dorothea Ogle, MD   Review of Systems  Constitutional: Negative.   HENT: Negative.   Eyes: Negative.   Respiratory: Negative.   Cardiovascular: Negative.   Gastrointestinal: Negative.   Genitourinary: Negative.   Musculoskeletal: Positive for back pain.  Skin: Negative.   Neurological: Negative.   Psychiatric/Behavioral: Negative.       Objective:   Filed Vitals:   07/13/14 1147  BP: 106/68  Pulse: 83  Temp: 98.1 F (36.7 C)  TempSrc: Oral  Resp: 16  Height: 5\' 10"  (1.778 m)  Weight: 139  lb (63.05 kg)  SpO2: 95%    Exam General appearance : Awake, alert, not in any distress. Speech Clear. Not toxic looking HEENT: Atraumatic and Normocephalic, pupils equally reactive to light and accomodation Neck: supple, no JVD. No cervical lymphadenopathy.  Chest:Good air entry bilaterally, no added sounds  CVS: S1 S2 regular, no murmurs.  Abdomen: Bowel sounds present, Non tender and not distended with no gaurding, rigidity or rebound. Spine: visibly abnormal lateral curvature of spinal column with healed surgical scarring. Lumbar spine tender to palpation on bilateral vertebral processes.   Extremities: B/L Lower Ext shows no edema, both legs are warm to  touch Neurology: Awake alert, and oriented X 3, CN II-XII intact, Non focal Skin:No Rash Wounds:N/A  Data Review Lab Results  Component Value Date   HGBA1C 9.5 07/13/2014   HGBA1C 9.6 03/16/2014     Assessment & Plan   1. Type 2 diabetes mellitus without complication  - Glucose (CBG) - HgB A1c - insulin aspart (novoLOG) injection 20 Units; Inject 0.2 mLs (20 Units total) into the skin once. -Mr Hernandez's blood glucose is currently uncontrolled on metformin 500 mg BID.  Add Farxiga 10 mg daily.  Reinforced diabetic diet.  Increase exercise as able to promote glucose uptake.  2. Chronic back pain  - tramadol  50 mg BID as needed for pain -cyclobenzaprine 10 mg three times a day as needed for muscle spasms/back pain -Once Allen Hernandez has obtained his orange card, we can refer him to pain clinic for more intensive management of his chronic pain.  Smoking cessation instruction/counseling given:  counseled patient on the dangers of tobacco use, advised patient to stop smoking, and reviewed strategies to maximize success  Follow up: 3 months for DM.  CBG, A1C.  If his A1C is not dramatically improved with the addition of [place] consider addition of insulin as discussed today.   The patient was given clear instructions to go to ER or return to medical center if symptoms don't improve, worsen or new problems develop. The patient verbalized understanding. The patient was told to call to get lab results if they haven't heard anything in the next week.   This note has been created with Education officer, environmentalDragon speech recognition software and smart phrase technology. Any transcriptional errors are unintentional.   Charlena CrossMelissa Kwesi Sangha- FNP student  Evaluation and management procedures were performed by the Advanced Practitioner under my supervision and collaboration. I have reviewed the Advanced Practitioner's note and chart, and I agree with the management and plan.   Jeanann LewandowskyJEGEDE, OLUGBEMIGA, MD, MHA, FACP,  FAAP [facility] [place], [place] [phone]   07/13/2014, 12:10 PM

## 2014-07-13 NOTE — Patient Instructions (Signed)
Scoliosis Scoliosis is the name given to a spine that curves sideways.Scoliosis can cause twisting of your shoulders, hips, chest, back, and rib cage.  CAUSES  The cause of scoliosis is not always known. It may be caused by a birth defect or by a disease that can cause muscular dysfunction and imbalance, such as cerebral palsy and muscular dystrophy.  RISK FACTORS Having a disease that causes muscle disease or dysfunction. SIGNS AND SYMPTOMS Scoliosis often has no signs or symptoms.If they are present, they may include:  Unequal size of one body side compared to the other (asymmetry).  Visible curvature of the spine.  Pain. The pain may limit physical activity.  Shortness of breath.  Bowel or bladder issues. DIAGNOSIS A skilled health care provider will perform an evaluation. This will involve:  Taking your history.  Performing a physical examination.  Performing a neurological exam to detect nerve or muscle function loss.  Range of motion studies on the spine.  X-rays. An MRI may also be obtained. TREATMENT  Treatment varies depending on the nature, extent, and severity of the disease. If the curvature is not great, you may need only observation. A brace may be used to prevent scoliosis from progressing. A brace may also be needed during growth spurts. Physical therapy may be of benefit. Surgery may be required.  HOME CARE INSTRUCTIONS   Your health care provider may suggest exercises to strengthen your muscles. Perform them as directed.  Ask your health care provider before participating in any sports.   If you have been prescribed an orthopedic brace, wear it as instructed by your health care provider. SEEK MEDICAL CARE IF: Your brace causes the skin to become sore (chafe) or is uncomfortable.  SEEK IMMEDIATE MEDICAL CARE IF:  You have back pain that is not relieved by the medicines prescribed by your health care provider.   Your legs feel weak or you lose  function in your legs.  You lose some bowel or bladder control.  Document Released: 09/22/2000 Document Revised: 09/30/2013 Document Reviewed: 06/02/2013 [facility] Patient Information 2015 [place], [place]. This information is not intended to replace advice given to you by your health care provider. Make sure you discuss any questions you have with your health care provider. Diabetes Mellitus and Food It is important for you to manage your blood sugar (glucose) level. Your blood glucose level can be greatly affected by what you eat. Eating healthier foods in the appropriate amounts throughout the day at about the same time each day will help you control your blood glucose level. It can also help slow or prevent worsening of your diabetes mellitus. Healthy eating may even help you improve the level of your blood pressure and reach or maintain a healthy weight.  HOW CAN FOOD AFFECT ME? Carbohydrates Carbohydrates affect your blood glucose level more than any other type of food. Your dietitian will help you determine how many carbohydrates to eat at each meal and teach you how to count carbohydrates. Counting carbohydrates is important to keep your blood glucose at a healthy level, especially if you are using insulin or taking certain medicines for diabetes mellitus. Alcohol Alcohol can cause sudden decreases in blood glucose (hypoglycemia), especially if you use insulin or take certain medicines for diabetes mellitus. Hypoglycemia can be a life-threatening condition. Symptoms of hypoglycemia (sleepiness, dizziness, and disorientation) are similar to symptoms of having too much alcohol.  If your health care provider has given you approval to drink alcohol, do so in moderation and  use the following guidelines:  Women should not have more than one drink per day, and men should not have more than two drinks per day. One drink is equal to:  12 oz of beer.  5 oz of wine.  1 oz of hard liquor.  Do not  drink on an empty stomach.  Keep yourself hydrated. Have water, diet soda, or unsweetened iced tea.  Regular soda, juice, and other mixers might contain a lot of carbohydrates and should be counted. WHAT FOODS ARE NOT RECOMMENDED? As you make food choices, it is important to remember that all foods are not the same. Some foods have fewer nutrients per serving than other foods, even though they might have the same number of calories or carbohydrates. It is difficult to get your body what it needs when you eat foods with fewer nutrients. Examples of foods that you should avoid that are high in calories and carbohydrates but low in nutrients include:  Trans fats (most processed foods list trans fats on the Nutrition Facts label).  Regular soda.  Juice.  Candy.  Sweets, such as cake, pie, doughnuts, and cookies.  Fried foods. WHAT FOODS CAN I EAT? Have nutrient-rich foods, which will nourish your body and keep you healthy. The food you should eat also will depend on several factors, including:  The calories you need.  The medicines you take.  Your weight.  Your blood glucose level.  Your blood pressure level.  Your cholesterol level. You also should eat a variety of foods, including:  Protein, such as meat, poultry, fish, tofu, nuts, and seeds (lean animal proteins are best).  Fruits.  Vegetables.  Dairy products, such as milk, cheese, and yogurt (low fat is best).  Breads, grains, pasta, cereal, rice, and beans.  Fats such as olive oil, trans fat-free margarine, canola oil, avocado, and olives. DOES EVERYONE WITH DIABETES MELLITUS HAVE THE SAME MEAL PLAN? Because every person with diabetes mellitus is different, there is not one meal plan that works for everyone. It is very important that you meet with a dietitian who will help you create a meal plan that is just right for you. Document Released: 06/22/2005 Document Revised: 09/30/2013 Document Reviewed:  08/22/2013 [facility] Patient Information 2015 [place], [place]. This information is not intended to replace advice given to you by your health care provider. Make sure you discuss any questions you have with your health care provider. Diabetes and Exercise Exercising regularly is important. It is not just about losing weight. It has many health benefits, such as:  Improving your overall fitness, flexibility, and endurance.  Increasing your bone density.  Helping with weight control.  Decreasing your body fat.  Increasing your muscle strength.  Reducing stress and tension.  Improving your overall health. People with diabetes who exercise gain additional benefits because exercise:  Reduces appetite.  Improves the body's use of blood sugar (glucose).  Helps lower or control blood glucose.  Decreases blood pressure.  Helps control blood lipids (such as cholesterol and triglycerides).  Improves the body's use of the hormone insulin by:  Increasing the body's insulin sensitivity.  Reducing the body's insulin needs.  Decreases the risk for heart disease because exercising:  Lowers cholesterol and triglycerides levels.  Increases the levels of good cholesterol (such as high-density lipoproteins [HDL]) in the body.  Lowers blood glucose levels. YOUR ACTIVITY PLAN  Choose an activity that you enjoy and set realistic goals. Your health care provider or diabetes educator can help you make an  activity plan that works for you. Exercise regularly as directed by your health care provider. This includes:  Performing resistance training twice a week such as push-ups, sit-ups, lifting weights, or using resistance bands.  Performing 150 minutes of cardio exercises each week such as walking, running, or playing sports.  Staying active and spending no more than 90 minutes at one time being inactive. Even short bursts of exercise are good for you. Three 10-minute sessions spread throughout  the day are just as beneficial as a single 30-minute session. Some exercise ideas include:  Taking the dog for a walk.  Taking the stairs instead of the elevator.  Dancing to your favorite song.  Doing an exercise video.  Doing your favorite exercise with a friend. RECOMMENDATIONS FOR EXERCISING WITH TYPE 1 OR TYPE 2 DIABETES   Check your blood glucose before exercising. If blood glucose levels are greater than 240 mg/dL, check for urine ketones. Do not exercise if ketones are present.  Avoid injecting insulin into areas of the body that are going to be exercised. For example, avoid injecting insulin into:  The arms when playing tennis.  The legs when jogging.  Keep a record of:  Food intake before and after you exercise.  Expected peak times of insulin action.  Blood glucose levels before and after you exercise.  The type and amount of exercise you have done.  Review your records with your health care provider. Your health care provider will help you to develop guidelines for adjusting food intake and insulin amounts before and after exercising.  If you take insulin or oral hypoglycemic agents, watch for signs and symptoms of hypoglycemia. They include:  Dizziness.  Shaking.  Sweating.  Chills.  Confusion.  Drink plenty of water while you exercise to prevent dehydration or heat stroke. Body water is lost during exercise and must be replaced.  Talk to your health care provider before starting an exercise program to make sure it is safe for you. Remember, almost any type of activity is better than none. Document Released: 12/16/2003 Document Revised: 02/09/2014 Document Reviewed: 03/04/2013 [facility] Patient Information 2015 [place]. This information is not intended to replace advice given to you by your health care provider. Make sure you discuss any questions you have with your health care provider.

## 2014-08-09 DIAGNOSIS — E1340 Other specified diabetes mellitus with diabetic neuropathy, unspecified: Secondary | ICD-10-CM

## 2014-08-09 HISTORY — DX: Other specified diabetes mellitus with diabetic neuropathy, unspecified: E13.40

## 2014-09-18 ENCOUNTER — Ambulatory Visit: Payer: Self-pay

## 2014-09-19 ENCOUNTER — Encounter (HOSPITAL_COMMUNITY): Payer: Self-pay | Admitting: Emergency Medicine

## 2014-09-19 ENCOUNTER — Emergency Department (HOSPITAL_COMMUNITY)
Admission: EM | Admit: 2014-09-19 | Discharge: 2014-09-19 | Disposition: A | Discharge status: Home / Self Care | Payer: Self-pay | Attending: Emergency Medicine | Admitting: Emergency Medicine

## 2014-09-19 DIAGNOSIS — Z79899 Other long term (current) drug therapy: Secondary | ICD-10-CM | POA: Insufficient documentation

## 2014-09-19 DIAGNOSIS — E1165 Type 2 diabetes mellitus with hyperglycemia: Secondary | ICD-10-CM | POA: Insufficient documentation

## 2014-09-19 DIAGNOSIS — G8929 Other chronic pain: Secondary | ICD-10-CM | POA: Insufficient documentation

## 2014-09-19 DIAGNOSIS — M549 Dorsalgia, unspecified: Secondary | ICD-10-CM | POA: Insufficient documentation

## 2014-09-19 DIAGNOSIS — M419 Scoliosis, unspecified: Secondary | ICD-10-CM | POA: Insufficient documentation

## 2014-09-19 DIAGNOSIS — Z720 Tobacco use: Secondary | ICD-10-CM | POA: Insufficient documentation

## 2014-09-19 DIAGNOSIS — R739 Hyperglycemia, unspecified: Secondary | ICD-10-CM

## 2014-09-19 HISTORY — DX: Type 2 diabetes mellitus without complications: E11.9

## 2014-09-19 LAB — COMPREHENSIVE METABOLIC PANEL
ALBUMIN: 3.5 g/dL (ref 3.5–5.2)
ALT: 19 U/L (ref 0–53)
AST: 14 U/L (ref 0–37)
Alkaline Phosphatase: 72 U/L (ref 39–117)
Anion gap: 13 (ref 5–15)
BUN: 8 mg/dL (ref 6–23)
CO2: 24 mEq/L (ref 19–32)
Calcium: 9.2 mg/dL (ref 8.4–10.5)
Chloride: 101 mEq/L (ref 96–112)
Creatinine, Ser: 0.7 mg/dL (ref 0.50–1.35)
GFR calc non Af Amer: >90 mL/min (ref >90)
GLUCOSE: 306 mg/dL — AB (ref 70–99)
POTASSIUM: 4.1 meq/L (ref 3.7–5.3)
SODIUM: 138 meq/L (ref 137–147)
TOTAL PROTEIN: 6.6 g/dL (ref 6.0–8.3)
Total Bilirubin: 0.3 mg/dL (ref 0.3–1.2)

## 2014-09-19 LAB — CBC WITH DIFFERENTIAL/PLATELET
BASOS ABS: 0 10*3/uL (ref 0.0–0.1)
Basophils Relative: 0 % (ref 0–1)
EOS ABS: 0.1 10*3/uL (ref 0.0–0.7)
Eosinophils Relative: 2 % (ref 0–5)
HCT: 41.2 % (ref 39.0–52.0)
Hemoglobin: 13.9 g/dL (ref 13.0–17.0)
Lymphocytes Relative: 23 % (ref 12–46)
Lymphs Abs: 1.4 10*3/uL (ref 0.7–4.0)
MCH: 29.2 pg (ref 26.0–34.0)
MCHC: 33.7 g/dL (ref 30.0–36.0)
MCV: 86.6 fL (ref 78.0–100.0)
Monocytes Absolute: 0.5 10*3/uL (ref 0.1–1.0)
Monocytes Relative: 9 % (ref 3–12)
NEUTROS PCT: 66 % (ref 43–77)
Neutro Abs: 4 10*3/uL (ref 1.7–7.7)
PLATELETS: 279 10*3/uL (ref 150–400)
RBC: 4.76 MIL/uL (ref 4.22–5.81)
RDW: 12.3 % (ref 11.5–15.5)
WBC: 6 10*3/uL (ref 4.0–10.5)

## 2014-09-19 LAB — CBG MONITORING, ED
Glucose-Capillary: 295 mg/dL — ABNORMAL HIGH (ref 70–99)
Glucose-Capillary: 213 mg/dL — ABNORMAL HIGH (ref 70–99)

## 2014-09-19 MED ORDER — SODIUM CHLORIDE 0.9 % IV BOLUS (SEPSIS)
1000.0000 mL | Freq: Once | INTRAVENOUS | Status: AC
  Administered 2014-09-19: 1000 mL via INTRAVENOUS

## 2014-09-19 MED ORDER — OXYCODONE-ACETAMINOPHEN 5-325 MG PO TABS
2.0000 | ORAL_TABLET | Freq: Once | ORAL | Status: AC
  Administered 2014-09-19: 2 via ORAL
  Filled 2014-09-19: qty 2

## 2014-09-19 MED ORDER — OXYCODONE-ACETAMINOPHEN 5-325 MG PO TABS: 1.0000 | ORAL_TABLET | Freq: Four times a day (QID) | ORAL | Status: DC | PRN

## 2014-09-19 MED ORDER — METFORMIN HCL 500 MG PO TABS: 500.0000 mg | ORAL_TABLET | Freq: Two times a day (BID) | ORAL | Status: DC

## 2014-09-19 NOTE — ED Notes (Signed)
CBG 213 

## 2014-09-19 NOTE — ED Notes (Signed)
Cbg was 295.

## 2014-09-19 NOTE — ED Provider Notes (Signed)
CSN: [id number]     Arrival date & time 09/19/14  1202 History   First MD Initiated Contact with Patient 09/19/14 1203     Chief Complaint  Patient presents with  . Hyperglycemia     (Consider location/radiation/quality/duration/timing/severity/associated sxs/prior Treatment) HPI Comments: Patient with a history of Type II DM presents today with a chief complaint of hyperglycemia.  He reports that this morning he felt as if his blood sugar was high, but did not check it.  He is currently on Metformin and [place].  However, he reports that he ran out of the Metformin yesterday.  He denies nausea, vomiting, abdominal pain, chest pain, SOB, fever, or chills.  He does report that his vision has been blurry intermittently over the past 3 months.  No vision changes today.  He also reports that he has numbness and tingling of his feet, but this has been present for years.  He does report chronic back pain.  He has a history of Scoliosis and reports that he has had back pain for several years.  Pain today is the same pain that he has chronically.  No acute injury or trauma.  No bowel or bladder incontinence.  No saddle anaesthesia.  He has not taken anything for pain prior to arrival.    The history is provided by the patient.    Past Medical History  Diagnosis Date  . Diabetes     recently diagnosed - 3 months ago   Past Surgical History  Procedure Laterality Date  . Spine surgery     Family History  Problem Relation Age of Onset  . Cancer Mother   . Cancer Father   . Diabetes Brother    History  Substance Use Topics  . Smoking status: Current Every Day Smoker -- 0.50 packs/day    Types: Cigarettes  . Smokeless tobacco: Not on file  . Alcohol Use: Yes     Comment: occasionally    Review of Systems  All other systems reviewed and are negative.     Allergies  Review of patient's allergies indicates no known allergies.  Home Medications   Prior to Admission medications    Medication Sig Start Date End Date Taking? Authorizing Provider  cyclobenzaprine (FLEXERIL) 10 MG tablet Take 1 tablet (10 mg total) by mouth 3 (three) times daily as needed for muscle spasms. 07/13/14   Quentin Angstlugbemiga E Jegede, MD  Dapagliflozin Propanediol (FARXIGA) 10 MG TABS Take 10 mg by mouth daily. 07/13/14   Quentin Angstlugbemiga E Jegede, MD  glucose blood (ACCU-CHEK AVIVA) test strip Use as instructed 07/13/14   Quentin Angstlugbemiga E Jegede, MD  ibuprofen (ADVIL,MOTRIN) 400 MG tablet Take 1 tablet (400 mg total) by mouth every 6 (six) hours as needed for fever. 11/04/13   Quentin Angstlugbemiga E Jegede, MD  metFORMIN (GLUCOPHAGE) 500 MG tablet Take 1 tablet (500 mg total) by mouth 2 (two) times daily with a meal. 07/13/14   Quentin Angstlugbemiga E Jegede, MD  oxyCODONE-acetaminophen (ROXICET) 5-325 MG per tablet Take 1 tablet by mouth every 4 (four) hours as needed for severe pain. 01/29/14   Dorothea OgleIskra M Myers, MD  traMADol (ULTRAM) 50 MG tablet Take 1 tablet (50 mg total) by mouth every 12 (twelve) hours as needed for severe pain. 07/13/14   Quentin Angstlugbemiga E Jegede, MD   BP 133/83 mmHg  Pulse 75  Temp(Src) 97.9 F (36.6 C) (Oral)  Resp 16  SpO2 96% Physical Exam  Constitutional: He appears well-developed and well-nourished.  HENT:  Head: Normocephalic and  atraumatic.  Mouth/Throat: Oropharynx is clear and moist.  Eyes: EOM are normal. Pupils are equal, round, and reactive to light.  Neck: Normal range of motion. Neck supple.  Cardiovascular: Normal rate, regular rhythm and normal heart sounds.   Pulmonary/Chest: Effort normal and breath sounds normal.  Abdominal: Soft. Bowel sounds are normal. He exhibits no distension and no mass. There is no tenderness. There is no rebound and no guarding.  Musculoskeletal: Normal range of motion.  Neurological: He is alert. He has normal strength. No sensory deficit. Gait normal.  Reflex Scores:      Patellar reflexes are 2+ on the right side and 2+ on the left side. Distal sensation of both feet  intact  Skin: Skin is warm and dry.  No wounds on the feet  Psychiatric: He has a normal mood and affect.  Nursing note and vitals reviewed.   ED Course  Procedures (including critical care time) Labs Review Labs Reviewed  CBG MONITORING, ED - Abnormal; Notable for the following:    Glucose-Capillary 295 (*)    All other components within normal limits  CBC WITH DIFFERENTIAL  COMPREHENSIVE METABOLIC PANEL    Imaging Review No results found.   EKG Interpretation   Date/Time:  Saturday September 19 2014 12:07:02 EST Ventricular Rate:  70 PR Interval:  128 QRS Duration: 84 QT Interval:  403 QTC Calculation: 435 R Axis:   70 Text Interpretation:  Sinus rhythm ST elevation suggests acute  pericarditis Sinus rhythm ST-t wave abnormality Abnormal ekg Confirmed by  Gerhard MunchLOCKWOOD, ROBERT  MD [phone]([id number]) on 09/19/2014 1:09:02 PM      MDM   Final diagnoses:  None   Patient with a history of Type II DM presents today with hyperglycemia.  Initial CBG is 295.  Patient with an Anion Gap of 13.  No nausea, vomiting, or abdominal pain.  Patient does not appear to be in DKA.  He states that he ran out of his Metformin.  Patient given IVF in the ED and blood sugar improved.  Labs today unremarkable.  Patient also complaining of chronic back pain.  No neurological deficits and normal neuro exam.  Patient ambulating without difficulty  No loss of bowel or bladder control.  No concern for cauda equina.  No fever, night sweats, weight loss, h/o cancer, IVDU.  RICE protocol and pain medicine indicated and discussed with patient.  Patient stable for discharge.  Patient given Rx for Metformin and also pain medication.  Return precautions discussed.     Santiago GladHeather Jasier Calabretta, PA-C 09/19/14 1556  Gerhard Munchobert Lockwood, MD 09/20/14 1031

## 2014-09-19 NOTE — ED Notes (Signed)
Spoke with Case Manager regarding pt's inability to afford medicine, will give resources

## 2014-09-19 NOTE — ED Notes (Signed)
To ED via [facility] from home with c/o high blood sugar, ran out of medicine last night, CBG high this morning 321 for [facility]. Also c/o blurry vision, feet burning and tingling.

## 2014-09-24 ENCOUNTER — Telehealth: Payer: Self-pay | Admitting: Internal Medicine

## 2014-09-25 NOTE — Telephone Encounter (Signed)
Comment: Pt saw Dr Izola PriceMyers on April and refer [facility] to [facility] but he didn't have insurance at that time .  Can you made a new referral for him Thank you Patient's # [phone]      Created by     Can I place referral?

## 2014-10-12 ENCOUNTER — Ambulatory Visit: Payer: Self-pay | Attending: Internal Medicine

## 2014-10-12 ENCOUNTER — Other Ambulatory Visit: Payer: Self-pay | Admitting: Emergency Medicine

## 2014-10-12 DIAGNOSIS — E119 Type 2 diabetes mellitus without complications: Secondary | ICD-10-CM

## 2014-10-12 MED ORDER — DAPAGLIFLOZIN PROPANEDIOL 10 MG PO TABS: 10.0000 mg | ORAL_TABLET | Freq: Every day | ORAL | Status: DC

## 2014-10-14 ENCOUNTER — Telehealth: Payer: Self-pay | Admitting: Internal Medicine

## 2014-10-14 NOTE — Telephone Encounter (Signed)
Pt returning call, Please f/u with pt.

## 2014-10-15 ENCOUNTER — Telehealth: Payer: Self-pay | Admitting: Internal Medicine

## 2014-10-15 ENCOUNTER — Ambulatory Visit: Payer: Self-pay | Admitting: Internal Medicine

## 2014-10-15 NOTE — Telephone Encounter (Signed)
Patient has called in today to request an appointment with Dr. Shela CommonsJ and to see if he can get a refill sample to hole him until he can get an appointment; patient missed appointment today because of car trouble; please f/u with patient about refill request;

## 2014-10-27 ENCOUNTER — Ambulatory Visit: Payer: No Typology Code available for payment source | Attending: Internal Medicine | Admitting: Internal Medicine

## 2014-10-27 ENCOUNTER — Encounter: Payer: Self-pay | Admitting: Internal Medicine

## 2014-10-27 VITALS — BP 128/85 | HR 96 | Temp 98.3°F | Resp 16 | Ht 69.0 in | Wt 145.0 lb

## 2014-10-27 DIAGNOSIS — G8929 Other chronic pain: Secondary | ICD-10-CM | POA: Insufficient documentation

## 2014-10-27 DIAGNOSIS — M4120 Other idiopathic scoliosis, site unspecified: Secondary | ICD-10-CM | POA: Diagnosis not present

## 2014-10-27 DIAGNOSIS — Z1211 Encounter for screening for malignant neoplasm of colon: Secondary | ICD-10-CM

## 2014-10-27 DIAGNOSIS — M549 Dorsalgia, unspecified: Secondary | ICD-10-CM | POA: Insufficient documentation

## 2014-10-27 DIAGNOSIS — Z79899 Other long term (current) drug therapy: Secondary | ICD-10-CM | POA: Diagnosis not present

## 2014-10-27 DIAGNOSIS — E119 Type 2 diabetes mellitus without complications: Secondary | ICD-10-CM | POA: Diagnosis not present

## 2014-10-27 LAB — POCT GLYCOSYLATED HEMOGLOBIN (HGB A1C): HEMOGLOBIN A1C: 7.6

## 2014-10-27 LAB — GLUCOSE, POCT (MANUAL RESULT ENTRY): POC GLUCOSE: 217 mg/dL — AB (ref 70–99)

## 2014-10-27 MED ORDER — DAPAGLIFLOZIN PROPANEDIOL 10 MG PO TABS: 10.0000 mg | ORAL_TABLET | Freq: Every day | ORAL | Status: DC

## 2014-10-27 MED ORDER — METFORMIN HCL 500 MG PO TABS: 500.0000 mg | ORAL_TABLET | Freq: Two times a day (BID) | ORAL | Status: DC

## 2014-10-27 MED ORDER — GABAPENTIN 100 MG PO CAPS: 100.0000 mg | ORAL_CAPSULE | Freq: Three times a day (TID) | ORAL | Status: DC

## 2014-10-27 MED ORDER — ACETAMINOPHEN-CODEINE #3 300-30 MG PO TABS: 1.0000 | ORAL_TABLET | ORAL | Status: DC | PRN

## 2014-10-27 MED ORDER — CYCLOBENZAPRINE HCL 10 MG PO TABS: 10.0000 mg | ORAL_TABLET | Freq: Three times a day (TID) | ORAL | Status: DC | PRN

## 2014-10-27 NOTE — Progress Notes (Signed)
Pt is here following up on his diabetes and his chronic pain in his back. Pt reports being out of all his medications.

## 2014-10-27 NOTE — Progress Notes (Signed)
Patient ID: Allen Hernandez, male   DOB: 1961/06/27, 54 y.o.   MRN: [id number]   Allen BirchwoodDamon Aman, is a 54 y.o. male  [id number]  [id number]  DOB - 1961/06/27  Chief Complaint  Patient presents with  . Follow-up        Subjective:   Allen Hernandez is a 54 y.o. male here today for a follow up visit. Patient has history of scoliosis and kyphosis with previous surgical interventions, diabetes mellitus on metformin and farxiga, chronic back pain, here in the clinic today for his routine follow-up. Patient has no new complaint. He is compliant with medications, he reports no side effects. He wants pain medication today, who preferred stronger narcotics like oxycodone. Patient has No headache, No chest pain, No abdominal pain - No Nausea, No new weakness tingling or numbness, No Cough - SOB.  No problems updated.  ALLERGIES: No Known Allergies  PAST MEDICAL HISTORY: Past Medical History  Diagnosis Date  . Diabetes     recently diagnosed - 3 months ago    MEDICATIONS AT HOME: Prior to Admission medications   Medication Sig Start Date End Date Taking? Authorizing Provider  cyclobenzaprine (FLEXERIL) 10 MG tablet Take 1 tablet (10 mg total) by mouth 3 (three) times daily as needed for muscle spasms. 10/27/14  Yes Quentin Angstlugbemiga E Shiesha Jahn, MD  dapagliflozin propanediol (FARXIGA) 10 MG TABS tablet Take 10 mg by mouth daily. 10/27/14  Yes Quentin Angstlugbemiga E Harmoney Sienkiewicz, MD  metFORMIN (GLUCOPHAGE) 500 MG tablet Take 1 tablet (500 mg total) by mouth 2 (two) times daily with a meal. 10/27/14  Yes Reilly Blades E Hyman HopesJegede, MD  acetaminophen-codeine (TYLENOL #3) 300-30 MG per tablet Take 1 tablet by mouth every 4 (four) hours as needed. 10/27/14   Quentin Angstlugbemiga E Krystelle Prashad, MD  gabapentin (NEURONTIN) 100 MG capsule Take 1 capsule (100 mg total) by mouth 3 (three) times daily. 10/27/14   Quentin Angstlugbemiga E Sandeep Radell, MD  glucose blood (ACCU-CHEK AVIVA) test strip Use as instructed Patient not taking: Reported on 10/27/2014 07/13/14    Quentin Angstlugbemiga E Margo Lama, MD  ibuprofen (ADVIL,MOTRIN) 400 MG tablet Take 1 tablet (400 mg total) by mouth every 6 (six) hours as needed for fever. Patient not taking: Reported on 10/27/2014 11/04/13   Quentin Angstlugbemiga E Javionna Leder, MD     Objective:   Filed Vitals:   10/27/14 1153  BP: 128/85  Pulse: 96  Temp: 98.3 F (36.8 C)  TempSrc: Oral  Resp: 16  Height: 5\' 9"  (1.753 m)  Weight: 145 lb (65.772 kg)  SpO2: 95%    Exam General appearance : Awake, alert, not in any distress. Speech Clear. Not toxic looking HEENT: Atraumatic and Normocephalic, pupils equally reactive to light and accomodation Neck: supple, no JVD. No cervical lymphadenopathy.  Chest:Good air entry bilaterally, no added sounds  CVS: S1 S2 regular, no murmurs.  Abdomen: Bowel sounds present, Non tender and not distended with no gaurding, rigidity or rebound. Extremities: Kyphoscoliosis, B/L Lower Ext shows no edema, both legs are warm to touch Neurology: Awake alert, and oriented X 3, CN II-XII intact, Non focal Skin:No Rash Wounds:N/A  Data Review Lab Results  Component Value Date   HGBA1C 7.60 10/27/2014   HGBA1C 9.5 07/13/2014   HGBA1C 9.6 03/16/2014     Assessment & Plan   1. Type 2 diabetes mellitus without complication  - Glucose (CBG) - HgB A1c is 7.6% today, down from 9.5% 3 months ago.  Refill - dapagliflozin propanediol (FARXIGA) 10 MG TABS tablet; Take 10 mg by mouth  daily.  Dispense: 90 tablet; Refill: 3 - metFORMIN (GLUCOPHAGE) 500 MG tablet; Take 1 tablet (500 mg total) by mouth 2 (two) times daily with a meal.  Dispense: 180 tablet; Refill: 3 - gabapentin (NEURONTIN) 100 MG capsule; Take 1 capsule (100 mg total) by mouth 3 (three) times daily.  Dispense: 90 capsule; Refill: 3  2. Scoliosis (and kyphoscoliosis), idiopathic  - acetaminophen-codeine (TYLENOL #3) 300-30 MG per tablet; Take 1 tablet by mouth every 4 (four) hours as needed.  Dispense: 60 tablet; Refill: 0 - cyclobenzaprine (FLEXERIL)  10 MG tablet; Take 1 tablet (10 mg total) by mouth 3 (three) times daily as needed for muscle spasms.  Dispense: 60 tablet; Refill: 3  3. Colon cancer screening  - HM COLONOSCOPY - Ambulatory referral to Gastroenterology   Patient was counseled extensively about nutrition and exercise. Patient was extensively counseled on smoking cessation.   Return in about 3 months (around 01/26/2015), or if symptoms worsen or fail to improve, for Hemoglobin A1C and Follow up, DM, Follow up Pain and comorbidities.  The patient was given clear instructions to go to ER or return to medical center if symptoms don't improve, worsen or new problems develop. The patient verbalized understanding. The patient was told to call to get lab results if they haven't heard anything in the next week.   This note has been created with Education officer, environmental. Any transcriptional errors are unintentional.    Jeanann Lewandowsky, MD, MHA, FACP, FAAP [facility] [place], [place] [phone]   10/27/2014, 12:43 PM

## 2014-10-27 NOTE — Patient Instructions (Signed)
Basic Carbohydrate Counting for Diabetes Mellitus Carbohydrate counting is a method for keeping track of the amount of carbohydrates you eat. Eating carbohydrates naturally increases the level of sugar (glucose) in your blood, so it is important for you to know the amount that is okay for you to have in every meal. Carbohydrate counting helps keep the level of glucose in your blood within normal limits. The amount of carbohydrates allowed is different for every person. A dietitian can help you calculate the amount that is right for you. Once you know the amount of carbohydrates you can have, you can count the carbohydrates in the foods you want to eat. Carbohydrates are found in the following foods:  Grains, such as breads and cereals.  Dried beans and soy products.  Starchy vegetables, such as potatoes, peas, and corn.  Fruit and fruit juices.  Milk and yogurt.  Sweets and snack foods, such as cake, cookies, candy, chips, soft drinks, and fruit drinks. CARBOHYDRATE COUNTING There are two ways to count the carbohydrates in your food. You can use either of the methods or a combination of both. Reading the "Nutrition Facts" on Packaged Food The "Nutrition Facts" is an area that is included on the labels of almost all packaged food and beverages in the [place]. It includes the serving size of that food or beverage and information about the nutrients in each serving of the food, including the grams (g) of carbohydrate per serving.  Decide the number of servings of this food or beverage that you will be able to eat or drink. Multiply that number of servings by the number of grams of carbohydrate that is listed on the label for that serving. The total will be the amount of carbohydrates you will be having when you eat or drink this food or beverage. Learning Standard Serving Sizes of Food When you eat food that is not packaged or does not include "Nutrition Facts" on the label, you need to  measure the servings in order to count the amount of carbohydrates.A serving of most carbohydrate-rich foods contains about 15 g of carbohydrates. The following list includes serving sizes of carbohydrate-rich foods that provide 15 g ofcarbohydrate per serving:   1 slice of bread (1 oz) or 1 six-inch tortilla.    of a hamburger bun or English muffin.  4-6 crackers.   cup unsweetened dry cereal.    cup hot cereal.   cup rice or pasta.    cup mashed potatoes or  of a large baked potato.  1 cup fresh fruit or one small piece of fruit.    cup canned or frozen fruit or fruit juice.  1 cup milk.   cup plain fat-free yogurt or yogurt sweetened with artificial sweeteners.   cup cooked dried beans or starchy vegetable, such as peas, corn, or potatoes.  Decide the number of standard-size servings that you will eat. Multiply that number of servings by 15 (the grams of carbohydrates in that serving). For example, if you eat 2 cups of strawberries, you will have eaten 2 servings and 30 g of carbohydrates (2 servings x 15 g = 30 g). For foods such as soups and casseroles, in which more than one food is mixed in, you will need to count the carbohydrates in each food that is included. EXAMPLE OF CARBOHYDRATE COUNTING Sample Dinner  3 oz chicken breast.   cup of brown rice.   cup of corn.  1 cup milk.   1 cup strawberries with   sugar-free whipped topping.  Carbohydrate Calculation Step 1: Identify the foods that contain carbohydrates:   Rice.   Corn.   Milk.   Strawberries. Step 2:Calculate the number of servings eaten of each:   2 servings of rice.   1 serving of corn.   1 serving of milk.   1 serving of strawberries. Step 3: Multiply each of those number of servings by 15 g:   2 servings of rice x 15 g = 30 g.   1 serving of corn x 15 g = 15 g.   1 serving of milk x 15 g = 15 g.   1 serving of strawberries x 15 g = 15 g. Step 4: Add  together all of the amounts to find the total grams of carbohydrates eaten: 30 g + 15 g + 15 g + 15 g = 75 g. Document Released: 09/25/2005 Document Revised: 02/09/2014 Document Reviewed: 08/22/2013 ExitCare Patient Information 2015 [employer]. This information is not intended to replace advice given to you by your health care provider. Make sure you discuss any questions you have with your health care provider. Diabetes and Exercise Exercising regularly is important. It is not just about losing weight. It has many health benefits, such as:  Improving your overall fitness, flexibility, and endurance.  Increasing your bone density.  Helping with weight control.  Decreasing your body fat.  Increasing your muscle strength.  Reducing stress and tension.  Improving your overall health. People with diabetes who exercise gain additional benefits because exercise:  Reduces appetite.  Improves the body's use of blood sugar (glucose).  Helps lower or control blood glucose.  Decreases blood pressure.  Helps control blood lipids (such as cholesterol and triglycerides).  Improves the body's use of the hormone insulin by:  Increasing the body's insulin sensitivity.  Reducing the body's insulin needs.  Decreases the risk for heart disease because exercising:  Lowers cholesterol and triglycerides levels.  Increases the levels of good cholesterol (such as high-density lipoproteins [HDL]) in the body.  Lowers blood glucose levels. YOUR ACTIVITY PLAN  Choose an activity that you enjoy and set realistic goals. Your health care provider or diabetes educator can help you make an activity plan that works for you. Exercise regularly as directed by your health care provider. This includes:  Performing resistance training twice a week such as push-ups, sit-ups, lifting weights, or using resistance bands.  Performing 150 minutes of cardio exercises each week such as walking, running, or  playing sports.  Staying active and spending no more than 90 minutes at one time being inactive. Even short bursts of exercise are good for you. Three 10-minute sessions spread throughout the day are just as beneficial as a single 30-minute session. Some exercise ideas include:  Taking the dog for a walk.  Taking the stairs instead of the elevator.  Dancing to your favorite song.  Doing an exercise video.  Doing your favorite exercise with a friend. RECOMMENDATIONS FOR EXERCISING WITH TYPE 1 OR TYPE 2 DIABETES   Check your blood glucose before exercising. If blood glucose levels are greater than 240 mg/dL, check for urine ketones. Do not exercise if ketones are present.  Avoid injecting insulin into areas of the body that are going to be exercised. For example, avoid injecting insulin into:  The arms when playing tennis.  The legs when jogging.  Keep a record of:  Food intake before and after you exercise.  Expected peak times of insulin action.  Blood   glucose levels before and after you exercise.  The type and amount of exercise you have done.  Review your records with your health care provider. Your health care provider will help you to develop guidelines for adjusting food intake and insulin amounts before and after exercising.  If you take insulin or oral hypoglycemic agents, watch for signs and symptoms of hypoglycemia. They include:  Dizziness.  Shaking.  Sweating.  Chills.  Confusion.  Drink plenty of water while you exercise to prevent dehydration or heat stroke. Body water is lost during exercise and must be replaced.  Talk to your health care provider before starting an exercise program to make sure it is safe for you. Remember, almost any type of activity is better than none. Document Released: 12/16/2003 Document Revised: 02/09/2014 Document Reviewed: 03/04/2013 ExitCare Patient Information 2015 [employer]. This information is not intended to  replace advice given to you by your health care provider. Make sure you discuss any questions you have with your health care provider.  

## 2014-10-28 ENCOUNTER — Encounter: Payer: Self-pay | Admitting: Gastroenterology

## 2014-11-24 ENCOUNTER — Encounter: Payer: Self-pay | Admitting: Physical Medicine & Rehabilitation

## 2014-12-16 ENCOUNTER — Encounter: Payer: PRIVATE HEALTH INSURANCE | Admitting: Gastroenterology

## 2014-12-21 ENCOUNTER — Ambulatory Visit (HOSPITAL_BASED_OUTPATIENT_CLINIC_OR_DEPARTMENT_OTHER): Payer: No Typology Code available for payment source | Admitting: Physical Medicine & Rehabilitation

## 2014-12-21 ENCOUNTER — Other Ambulatory Visit: Payer: Self-pay | Admitting: Physical Medicine & Rehabilitation

## 2014-12-21 ENCOUNTER — Encounter: Payer: Self-pay | Admitting: Physical Medicine & Rehabilitation

## 2014-12-21 ENCOUNTER — Encounter: Payer: No Typology Code available for payment source | Attending: Physical Medicine & Rehabilitation

## 2014-12-21 VITALS — BP 128/80 | HR 66 | Resp 14

## 2014-12-21 DIAGNOSIS — E119 Type 2 diabetes mellitus without complications: Secondary | ICD-10-CM | POA: Diagnosis not present

## 2014-12-21 DIAGNOSIS — Z5181 Encounter for therapeutic drug level monitoring: Secondary | ICD-10-CM | POA: Insufficient documentation

## 2014-12-21 DIAGNOSIS — M4120 Other idiopathic scoliosis, site unspecified: Secondary | ICD-10-CM | POA: Insufficient documentation

## 2014-12-21 DIAGNOSIS — Z79899 Other long term (current) drug therapy: Secondary | ICD-10-CM

## 2014-12-21 MED ORDER — METHOCARBAMOL 500 MG PO TABS: 500.0000 mg | ORAL_TABLET | Freq: Four times a day (QID) | ORAL | Status: DC

## 2014-12-21 MED ORDER — GABAPENTIN 300 MG PO CAPS: 300.0000 mg | ORAL_CAPSULE | Freq: Three times a day (TID) | ORAL | Status: DC

## 2014-12-21 NOTE — Progress Notes (Signed)
Subjective:    Patient ID: Allen Hernandez, male    DOB: 1961-10-07, 54 y.o.   MRN: [id number]  HPI 54 year old male with chronic low back pain. He has a history of juvenile scoliosis. He wore a [place] brace as a teenager but then underwent multi level instrumentation with Harrington rods for severe scoliosis. 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 He did not go through any type of drug abuse treatment program. He has had no other episodes of pain medicine addiction. He has tried other pain medications such as oxycodone 5 mg as well as Tylenol 3 both of which gave him short-term relief. He has every day pain, has nocturnal pain as well. He has mainly pain in the buttocks low back but also has some stinging in the shoulder blade left side. No recent physical therapy  pt has some limitations with activities of daily living. He has difficulty putting on shoes and socks cleaning his feet as well as clipping his toenails. He is now diabetic and wants to take care of his feet better but has difficulty doing so. Pain Inventory Average Pain 8 Pain Right Now 9 My pain is constant, sharp, burning, dull, stabbing, tingling and aching  In the last 24 hours, has pain interfered with the following? General activity 4 Relation with others 4 Enjoyment of life 4 What TIME of day is your pain at its worst? daytime Sleep (in general) Poor  Pain is worse with: sitting, standing and some activites Pain improves with: rest and medication Relief from Meds: 0  Mobility walk without assistance how many minutes can you walk? 5-10 ability to climb steps?  yes do you drive?  no  Function disabled: date disabled .  Neuro/Psych weakness numbness tingling trouble walking spasms depression  Prior Studies Any changes since last visit?  no  Physicians involved in your care Any changes since last visit?  no   Family History  Problem Relation Age of Onset  . Cancer  Mother   . Cancer Father   . Diabetes Brother    History   Social History  . Marital Status: Single    Spouse Name: N/A  . Number of Children: N/A  . Years of Education: N/A   Social History Main Topics  . Smoking status: Current Every Day Smoker -- 0.25 packs/day    Types: Cigarettes  . Smokeless tobacco: Not on file  . Alcohol Use: 0.0 oz/week    0 Standard drinks or equivalent per week     Comment: occasionally  . Drug Use: No  . Sexual Activity: Yes   Other Topics Concern  . None   Social History Narrative   Past Surgical History  Procedure Laterality Date  . Spine surgery     Past Medical History  Diagnosis Date  . Diabetes     recently diagnosed - 3 months ago   BP 128/80 mmHg  Pulse 66  Resp 14  SpO2 98%  Opioid Risk Score: 1 Fall Risk Score: Low Fall Risk (0-5 points)  Review of Systems  HENT: Negative.   Eyes: Negative.   Respiratory: Negative.   Cardiovascular: Negative.   Gastrointestinal: Negative.   Endocrine: Negative.   Genitourinary: Negative.   Musculoskeletal: Positive for back pain and arthralgias.       Left knee pain  Skin: Negative.   Allergic/Immunologic: Negative.   Neurological: Positive for weakness and numbness.       Tingling, spasms  Hematological: Negative.  Psychiatric/Behavioral: Positive for dysphoric mood.       Objective:   Physical Exam  Constitutional: He is oriented to person, place, and time. He appears well-developed and well-nourished.  Neurological: He is alert and oriented to person, place, and time. A sensory deficit is present. Gait abnormal.  Reflex Scores:      Tricep reflexes are 3+ on the right side and 2+ on the left side.      Bicep reflexes are 3+ on the right side and 2+ on the left side.      Brachioradialis reflexes are 3+ on the right side and 2+ on the left side.      Patellar reflexes are 2+ on the right side and 2+ on the left side.      Achilles reflexes are 1+ on the right side and 1+  on the left side. Absent Vibratory sense left great toe Mildly reduced Vibratory sense right great toe  Decreased sensation to pin prick bilateral feet as well as right ankle  Walks with a forward flexed posture  Manual muscle testing limited by pain. At least 4/5 bilateral hip flexors and knee extensors ankle dorsi flexor plantar flexor 5/5 strength bilateral biceps grip 4/5 triceps complains of back pain with this.    Psychiatric: He has a normal mood and affect.  Nursing note and vitals reviewed.  S-shaped curve lumbar convex left, thoracic convex right Healed incision T4-L4 Tenderness left greater than the right PSIS       Assessment & Plan:  1. Chronic low back pain with history of severe juvenile scoliosis status post instrumentation. We will need to check plain films to look for any instrumentation failure If there is evidence of instrumentation failure will refer to spine surgery  Pain is primarily left PSIS area which may be consistent with a sacroiliac disorder which would be common after spinal fusion especially chronically We'll schedule for diagnostic/therapeutic sacroiliac injection left side  Other potential pain generators Include lumbar facet joints or perhaps lumbar disc degeneration below fused levels X-ray should be helpful in looking at disc spaces  From a medication standpoint he has moderate opioid risk. He does have adequate indication based on severity of scoliosis as a potential cause of severe unremitting chronic pain. We'll need to check urine drug screen as discussed with patient. If this is consistent, we can call intolerable #4 which can be used until patient comes in for follow-up and see how he does with this, other considerations would be Nucynta given his history of diabetes this may help with his neuropathic pain.  2. Diabetic neuropathy will increase Neurontin to 300 mg 3 times a day, as noted above if we switch him to Nucynta he may not need  to be on the gabapentin

## 2014-12-21 NOTE — Patient Instructions (Signed)
We will check x-rays at [facility] this is just across the street  If x-rays show some problems with hardware, I would refer you to a spine surgeon  Need to check results of urine drug screen to make sure there no illegal drugs or pain medicines that you have not disclosed  If urine drug screen looks okay, we can call in some pain medicine until your next visit. We may need to adjust the medicine a couple times to make it most effective  Schedule for left sacroiliac injection under x-ray guidance next visit  Will write orders for physical therapy once we have pain under better control

## 2014-12-22 LAB — PRESCRIPTION MONITORING PROFILE (SOLSTAS)
AMPHETAMINE/METH: NEGATIVE ng/mL
Barbiturate Screen, Urine: NEGATIVE ng/mL
Benzodiazepine Screen, Urine: NEGATIVE ng/mL
Buprenorphine, Urine: NEGATIVE ng/mL
CANNABINOID SCRN UR: NEGATIVE ng/mL
COCAINE METABOLITES: NEGATIVE ng/mL
Carisoprodol, Urine: NEGATIVE ng/mL
Creatinine, Urine: 173.13 mg/dL (ref >20.0)
FENTANYL URINE: NEGATIVE ng/mL
MDMA URINE: NEGATIVE ng/mL
METHADONE SCREEN, URINE: NEGATIVE ng/mL
Meperidine, Ur: NEGATIVE ng/mL
Nitrites, Initial: NEGATIVE ug/mL
Opiate Screen, Urine: NEGATIVE ng/mL
Oxycodone Screen, Ur: NEGATIVE ng/mL
PH URINE, INITIAL: 5.3 pH (ref 4.5–8.9)
Propoxyphene: NEGATIVE ng/mL
Tapentadol, urine: NEGATIVE ng/mL
Tramadol Scrn, Ur: NEGATIVE ng/mL
ZOLPIDEM, URINE: NEGATIVE ng/mL

## 2014-12-22 LAB — PMP ALCOHOL METABOLITE (ETG): Ethyl Glucuronide (EtG): NEGATIVE ng/mL

## 2014-12-29 ENCOUNTER — Telehealth: Payer: Self-pay | Admitting: *Deleted

## 2014-12-29 MED ORDER — ACETAMINOPHEN-CODEINE #4 300-60 MG PO TABS: 1.0000 | ORAL_TABLET | Freq: Three times a day (TID) | ORAL | Status: DC | PRN

## 2014-12-29 NOTE — Telephone Encounter (Signed)
Masoud called about UDS results and getting pain medication.  His UDS results were consistent with having no medication .

## 2014-12-29 NOTE — Telephone Encounter (Signed)
Tylenol No. 4 No. 90 one to 2 tablets 3 times per day no refills

## 2014-12-29 NOTE — Telephone Encounter (Signed)
I notified Jyron and told him 1-2 q 8 hours prn but be aware that the #90 must last one month. [facility] [place] [facility]

## 2015-01-04 ENCOUNTER — Encounter: Payer: Self-pay | Admitting: Internal Medicine

## 2015-01-04 ENCOUNTER — Ambulatory Visit: Payer: No Typology Code available for payment source | Attending: Internal Medicine | Admitting: Internal Medicine

## 2015-01-04 ENCOUNTER — Ambulatory Visit
Admission: RE | Admit: 2015-01-04 | Discharge: 2015-01-04 | Disposition: A | Discharge status: Home / Self Care | Payer: No Typology Code available for payment source | Source: Ambulatory Visit | Attending: Internal Medicine | Admitting: Internal Medicine

## 2015-01-04 VITALS — BP 128/84 | HR 75 | Temp 98.1°F | Ht 69.0 in | Wt 141.6 lb

## 2015-01-04 DIAGNOSIS — E119 Type 2 diabetes mellitus without complications: Secondary | ICD-10-CM

## 2015-01-04 DIAGNOSIS — M4120 Other idiopathic scoliosis, site unspecified: Secondary | ICD-10-CM

## 2015-01-04 MED ORDER — GABAPENTIN 300 MG PO CAPS: 300.0000 mg | ORAL_CAPSULE | Freq: Three times a day (TID) | ORAL | Status: DC

## 2015-01-04 MED ORDER — METFORMIN HCL 500 MG PO TABS: 500.0000 mg | ORAL_TABLET | Freq: Two times a day (BID) | ORAL | Status: DC

## 2015-01-04 MED ORDER — DAPAGLIFLOZIN PROPANEDIOL 10 MG PO TABS: 10.0000 mg | ORAL_TABLET | Freq: Every day | ORAL | Status: DC

## 2015-01-04 NOTE — Progress Notes (Signed)
Patient presents today for medication management. patient states that his "nerves" are so bad that he would like the dosage of his gabapentin increased. His back pain is 7/10 today and he still is having "tingling" feeling in his arms, hands and legs.

## 2015-01-04 NOTE — Patient Instructions (Signed)
Diabetes and Exercise Exercising regularly is important. It is not just about losing weight. It has many health benefits, such as:  Improving your overall fitness, flexibility, and endurance.  Increasing your bone density.  Helping with weight control.  Decreasing your body fat.  Increasing your muscle strength.  Reducing stress and tension.  Improving your overall health. People with diabetes who exercise gain additional benefits because exercise:  Reduces appetite.  Improves the body's use of blood sugar (glucose).  Helps lower or control blood glucose.  Decreases blood pressure.  Helps control blood lipids (such as cholesterol and triglycerides).  Improves the body's use of the hormone insulin by:  Increasing the body's insulin sensitivity.  Reducing the body's insulin needs.  Decreases the risk for heart disease because exercising:  Lowers cholesterol and triglycerides levels.  Increases the levels of good cholesterol (such as high-density lipoproteins [HDL]) in the body.  Lowers blood glucose levels. YOUR ACTIVITY PLAN  Choose an activity that you enjoy and set realistic goals. Your health care provider or diabetes educator can help you make an activity plan that works for you. Exercise regularly as directed by your health care provider. This includes:  Performing resistance training twice a week such as push-ups, sit-ups, lifting weights, or using resistance bands.  Performing 150 minutes of cardio exercises each week such as walking, running, or playing sports.  Staying active and spending no more than 90 minutes at one time being inactive. Even short bursts of exercise are good for you. Three 10-minute sessions spread throughout the day are just as beneficial as a single 30-minute session. Some exercise ideas include:  Taking the dog for a walk.  Taking the stairs instead of the elevator.  Dancing to your favorite song.  Doing an exercise  video.  Doing your favorite exercise with a friend. RECOMMENDATIONS FOR EXERCISING WITH TYPE 1 OR TYPE 2 DIABETES   Check your blood glucose before exercising. If blood glucose levels are greater than 240 mg/dL, check for urine ketones. Do not exercise if ketones are present.  Avoid injecting insulin into areas of the body that are going to be exercised. For example, avoid injecting insulin into:  The arms when playing tennis.  The legs when jogging.  Keep a record of:  Food intake before and after you exercise.  Expected peak times of insulin action.  Blood glucose levels before and after you exercise.  The type and amount of exercise you have done.  Review your records with your health care provider. Your health care provider will help you to develop guidelines for adjusting food intake and insulin amounts before and after exercising.  If you take insulin or oral hypoglycemic agents, watch for signs and symptoms of hypoglycemia. They include:  Dizziness.  Shaking.  Sweating.  Chills.  Confusion.  Drink plenty of water while you exercise to prevent dehydration or heat stroke. Body water is lost during exercise and must be replaced.  Talk to your health care provider before starting an exercise program to make sure it is safe for you. Remember, almost any type of activity is better than none. Document Released: 12/16/2003 Document Revised: 02/09/2014 Document Reviewed: 03/04/2013 ExitCare Patient Information 2015 [employer]. This information is not intended to replace advice given to you by your health care provider. Make sure you discuss any questions you have with your health care provider. Basic Carbohydrate Counting for Diabetes Mellitus Carbohydrate counting is a method for keeping track of the amount of carbohydrates you eat.   Eating carbohydrates naturally increases the level of sugar (glucose) in your blood, so it is important for you to know the amount that is  okay for you to have in every meal. Carbohydrate counting helps keep the level of glucose in your blood within normal limits. The amount of carbohydrates allowed is different for every person. A dietitian can help you calculate the amount that is right for you. Once you know the amount of carbohydrates you can have, you can count the carbohydrates in the foods you want to eat. Carbohydrates are found in the following foods:  Grains, such as breads and cereals.  Dried beans and soy products.  Starchy vegetables, such as potatoes, peas, and corn.  Fruit and fruit juices.  Milk and yogurt.  Sweets and snack foods, such as cake, cookies, candy, chips, soft drinks, and fruit drinks. CARBOHYDRATE COUNTING There are two ways to count the carbohydrates in your food. You can use either of the methods or a combination of both. Reading the "Nutrition Facts" on Packaged Food The "Nutrition Facts" is an area that is included on the labels of almost all packaged food and beverages in the [place]. It includes the serving size of that food or beverage and information about the nutrients in each serving of the food, including the grams (g) of carbohydrate per serving.  Decide the number of servings of this food or beverage that you will be able to eat or drink. Multiply that number of servings by the number of grams of carbohydrate that is listed on the label for that serving. The total will be the amount of carbohydrates you will be having when you eat or drink this food or beverage. Learning Standard Serving Sizes of Food When you eat food that is not packaged or does not include "Nutrition Facts" on the label, you need to measure the servings in order to count the amount of carbohydrates.A serving of most carbohydrate-rich foods contains about 15 g of carbohydrates. The following list includes serving sizes of carbohydrate-rich foods that provide 15 g ofcarbohydrate per serving:   1 slice of bread  (1 oz) or 1 six-inch tortilla.    of a hamburger bun or English muffin.  4-6 crackers.   cup unsweetened dry cereal.    cup hot cereal.   cup rice or pasta.    cup mashed potatoes or  of a large baked potato.  1 cup fresh fruit or one small piece of fruit.    cup canned or frozen fruit or fruit juice.  1 cup milk.   cup plain fat-free yogurt or yogurt sweetened with artificial sweeteners.   cup cooked dried beans or starchy vegetable, such as peas, corn, or potatoes.  Decide the number of standard-size servings that you will eat. Multiply that number of servings by 15 (the grams of carbohydrates in that serving). For example, if you eat 2 cups of strawberries, you will have eaten 2 servings and 30 g of carbohydrates (2 servings x 15 g = 30 g). For foods such as soups and casseroles, in which more than one food is mixed in, you will need to count the carbohydrates in each food that is included. EXAMPLE OF CARBOHYDRATE COUNTING Sample Dinner  3 oz chicken breast.   cup of brown rice.   cup of corn.  1 cup milk.   1 cup strawberries with sugar-free whipped topping.  Carbohydrate Calculation Step 1: Identify the foods that contain carbohydrates:   Rice.   Corn.     Milk.   Strawberries. Step 2:Calculate the number of servings eaten of each:   2 servings of rice.   1 serving of corn.   1 serving of milk.   1 serving of strawberries. Step 3: Multiply each of those number of servings by 15 g:   2 servings of rice x 15 g = 30 g.   1 serving of corn x 15 g = 15 g.   1 serving of milk x 15 g = 15 g.   1 serving of strawberries x 15 g = 15 g. Step 4: Add together all of the amounts to find the total grams of carbohydrates eaten: 30 g + 15 g + 15 g + 15 g = 75 g. Document Released: 09/25/2005 Document Revised: 02/09/2014 Document Reviewed: 08/22/2013 ExitCare Patient Information 2015 [employer]. This information is not intended to  replace advice given to you by your health care provider. Make sure you discuss any questions you have with your health care provider.  

## 2015-01-04 NOTE — Progress Notes (Signed)
Patient ID: Allen Hernandez, male   DOB: 1961-06-20, 54 y.o.   MRN: [id number]   Allen Hernandez, is a 54 y.o. male  WUJ:[id number]  [id number]:[id number]  DOB - 1961-06-25  Chief Complaint  Patient presents with  . Medication Management  . Diabetes  . Back Pain        Subjective:   Allen Hernandez is a 54 y.o. male here today for a follow up visit. Patient with history of kyphoscoliosis status post surgical interventions, diabetes mellitus on metformin and farxiga, chronic back pain presenting today for follow-up and medication management. Patient stated that his "nerves" are so bad that he would like the dosage of his gabapentin to be increased, he rates his back pain as 7 out of 10 associated with tingling in his arms and hands and legs. He has no fall, no physical trauma to the back. Patient has No headache, No chest pain, No abdominal pain - No Nausea, No Cough - SOB.  No problems updated.  ALLERGIES: No Known Allergies  PAST MEDICAL HISTORY: Past Medical History  Diagnosis Date  . Diabetes     recently diagnosed - 3 months ago  . Neuropathy due to secondary diabetes 08/2014    MEDICATIONS AT HOME: Prior to Admission medications   Medication Sig Start Date End Date Taking? Authorizing Provider  acetaminophen-codeine (TYLENOL #4) 300-60 MG per tablet Take 1 tablet by mouth every 8 (eight) hours as needed for moderate pain. May take 2 tablets on occasion if needed but # 90 must last one month 12/29/14  Yes Erick Colace, MD  dapagliflozin propanediol (FARXIGA) 10 MG TABS tablet Take 10 mg by mouth daily. 01/04/15  Yes Quentin Angst, MD  gabapentin (NEURONTIN) 300 MG capsule Take 1 capsule (300 mg total) by mouth 3 (three) times daily. 01/04/15  Yes Quentin Angst, MD  glucose blood (ACCU-CHEK AVIVA) test strip Use as instructed 07/13/14  Yes Quentin Angst, MD  metFORMIN (GLUCOPHAGE) 500 MG tablet Take 1 tablet (500 mg total) by mouth 2 (two) times daily with a meal.  01/04/15  Yes Quentin Angst, MD  methocarbamol (ROBAXIN) 500 MG tablet Take 1 tablet (500 mg total) by mouth 4 (four) times daily. 12/21/14  Yes Erick Colace, MD  cyclobenzaprine (FLEXERIL) 10 MG tablet Take 1 tablet (10 mg total) by mouth 3 (three) times daily as needed for muscle spasms. Patient not taking: Reported on 01/04/2015 10/27/14   Quentin Angst, MD  ibuprofen (ADVIL,MOTRIN) 400 MG tablet Take 1 tablet (400 mg total) by mouth every 6 (six) hours as needed for fever. Patient not taking: Reported on 10/27/2014 11/04/13   Quentin Angst, MD     Objective:   Filed Vitals:   01/04/15 1439  BP: 128/84  Pulse: 75  Temp: 98.1 F (36.7 C)  TempSrc: Oral  Height:  (1.753 m)  Weight: 141 lb 9.6 oz (64.229 kg)  SpO2: 98%    Exam General appearance : Awake, alert, not in any distress. Speech Clear. Not toxic looking HEENT: Atraumatic and Normocephalic, pupils equally reactive to light and accomodation Neck: supple, no JVD. No cervical lymphadenopathy.  Chest:Good air entry bilaterally, no added sounds  CVS: S1 S2 regular, no murmurs.  Abdomen: Bowel sounds present, Non tender and not distended with no gaurding, rigidity or rebound. Extremities: B/L Lower Ext shows no edema, both legs are warm to touch Neurology: Awake alert, and oriented X 3, CN II-XII intact, Non focal Skin:No Rash  Data Review Lab Results  Component Value Date   HGBA1C 7.60 10/27/2014   HGBA1C 9.5 07/13/2014   HGBA1C 9.6 03/16/2014     Assessment & Plan   1. Scoliosis (and kyphoscoliosis), idiopathic  - DG Lumbar Spine Complete; Future  2. Type 2 diabetes mellitus without complication: Blood sugar seems better controlled, last hemoglobin A1c shows downward trend. Will continue current dosage of antidiabetics, will increase gabapentin to 300 mg capsule by mouth 3 times a day.  Increase - gabapentin (NEURONTIN) 300 MG capsule; Take 1 capsule (300 mg total) by mouth 3 (three)  times daily.  Dispense: 90 capsule; Refill: 3 - metFORMIN (GLUCOPHAGE) 500 MG tablet; Take 1 tablet (500 mg total) by mouth 2 (two) times daily with a meal.  Dispense: 180 tablet; Refill: 3 - dapagliflozin propanediol (FARXIGA) 10 MG TABS tablet; Take 10 mg by mouth daily.  Dispense: 90 tablet; Refill: 3  Aim for 30 minutes of exercise most days. Rethink what you drink. Water is great! Aim for 2-3 Carb Choices per meal (30-45 grams) +/- 1 either way  Aim for 0-15 Carbs per snack if hungry  Include protein in moderation with your meals and snacks  Consider reading food labels for Total Carbohydrate and Fat Grams of foods  Consider checking BG at alternate times per day  Continue taking medication as directed Be mindful about how much sugar you are adding to beverages and other foods. Fruit Punch - find one with no sugar  Measure and decrease portions of carbohydrate foods  Make your plate and don't go back for seconds  Patient have been counseled extensively about nutrition and exercise  Return in about 3 months (around 04/06/2015), or if symptoms worsen or fail to improve, for Follow up Pain and comorbidities, Hemoglobin A1C and Follow up, DM.  The patient was given clear instructions to go to ER or return to medical center if symptoms don't improve, worsen or new problems develop. The patient verbalized understanding. The patient was told to call to get lab results if they haven't heard anything in the next week.   This note has been created with Education officer, environmentalDragon speech recognition software and smart phrase technology. Any transcriptional errors are unintentional.    Jeanann LewandowskyJEGEDE, Carolynne Schuchard, MD, MHA, CPE, FACP, FAAP [facility] [place] , [place] [phone]   01/04/2015, 3:16 PM

## 2015-01-07 ENCOUNTER — Telehealth: Payer: Self-pay | Admitting: *Deleted

## 2015-01-07 DIAGNOSIS — M4120 Other idiopathic scoliosis, site unspecified: Secondary | ICD-10-CM

## 2015-01-07 MED ORDER — ACETAMINOPHEN-CODEINE #4 300-60 MG PO TABS: 1.0000 | ORAL_TABLET | Freq: Three times a day (TID) | ORAL | Status: DC | PRN

## 2015-01-07 NOTE — Telephone Encounter (Signed)
Called to get his tylenol #4.  He says that [facility] says they do not have the order that was called in on 12/29/14.  He is requesting it be called to the new [facility] on [address]  I have made the switch in his pharmacy [facility]  He is also asking about the xray Dr Wynn BankerKirsteins ordered.  [facility] would [facility] on [address]  Closer to him.  I will make the change in Epic.

## 2015-01-11 ENCOUNTER — Other Ambulatory Visit: Payer: Self-pay | Admitting: Physical Medicine & Rehabilitation

## 2015-01-11 ENCOUNTER — Telehealth: Payer: Self-pay | Admitting: *Deleted

## 2015-01-11 ENCOUNTER — Encounter (HOSPITAL_COMMUNITY): Payer: Self-pay | Admitting: *Deleted

## 2015-01-11 ENCOUNTER — Telehealth: Payer: Self-pay

## 2015-01-11 ENCOUNTER — Ambulatory Visit
Admission: RE | Admit: 2015-01-11 | Discharge: 2015-01-11 | Disposition: A | Discharge status: Home / Self Care | Payer: No Typology Code available for payment source | Source: Ambulatory Visit | Attending: Physical Medicine & Rehabilitation | Admitting: Physical Medicine & Rehabilitation

## 2015-01-11 ENCOUNTER — Emergency Department (HOSPITAL_COMMUNITY)
Admission: EM | Admit: 2015-01-11 | Discharge: 2015-01-11 | Disposition: A | Discharge status: Home / Self Care | Payer: No Typology Code available for payment source | Attending: Emergency Medicine | Admitting: Emergency Medicine

## 2015-01-11 DIAGNOSIS — M4120 Other idiopathic scoliosis, site unspecified: Secondary | ICD-10-CM

## 2015-01-11 DIAGNOSIS — G8929 Other chronic pain: Secondary | ICD-10-CM | POA: Insufficient documentation

## 2015-01-11 DIAGNOSIS — Z79899 Other long term (current) drug therapy: Secondary | ICD-10-CM | POA: Diagnosis not present

## 2015-01-11 DIAGNOSIS — M549 Dorsalgia, unspecified: Secondary | ICD-10-CM

## 2015-01-11 DIAGNOSIS — M545 Low back pain: Secondary | ICD-10-CM | POA: Diagnosis not present

## 2015-01-11 DIAGNOSIS — M546 Pain in thoracic spine: Secondary | ICD-10-CM | POA: Diagnosis not present

## 2015-01-11 DIAGNOSIS — E119 Type 2 diabetes mellitus without complications: Secondary | ICD-10-CM | POA: Diagnosis not present

## 2015-01-11 DIAGNOSIS — Z720 Tobacco use: Secondary | ICD-10-CM | POA: Insufficient documentation

## 2015-01-11 NOTE — Telephone Encounter (Signed)
Patient is aware of his x ray results 

## 2015-01-11 NOTE — ED Notes (Signed)
Pt states cbg today was 241 and that is normal for him.  He takes pills.  Pt is on several medications

## 2015-01-11 NOTE — Discharge Instructions (Signed)
Back Pain, Adult °Back pain is very common. The pain often gets better over time. The cause of back pain is usually not dangerous. Most people can learn to manage their back pain on their own.  °HOME CARE  °· Stay active. Start with short walks on flat ground if you can. Try to walk farther each day. °· Do not sit, drive, or stand in one place for more than 30 minutes. Do not stay in bed. °· Do not avoid exercise or work. Activity can help your back heal faster. °· Be careful when you bend or lift an object. Bend at your knees, keep the object close to you, and do not twist. °· Sleep on a firm mattress. Lie on your side, and bend your knees. If you lie on your back, put a pillow under your knees. °· Only take medicines as told by your doctor. °· Put ice on the injured area. °¨ Put ice in a plastic bag. °¨ Place a towel between your skin and the bag. °¨ Leave the ice on for 15-20 minutes, 03-04 times a day for the first 2 to 3 days. After that, you can switch between ice and heat packs. °· Ask your doctor about back exercises or massage. °· Avoid feeling anxious or stressed. Find good ways to deal with stress, such as exercise. °GET HELP RIGHT AWAY IF:  °· Your pain does not go away with rest or medicine. °· Your pain does not go away in 1 week. °· You have new problems. °· You do not feel well. °· The pain spreads into your legs. °· You cannot control when you poop (bowel movement) or pee (urinate). °· Your arms or legs feel weak or lose feeling (numbness). °· You feel sick to your stomach (nauseous) or throw up (vomit). °· You have belly (abdominal) pain. °· You feel like you may pass out (faint). °MAKE SURE YOU:  °· Understand these instructions. °· Will watch your condition. °· Will get help right away if you are not doing well or get worse. °Document Released: 03/13/2008 Document Revised: 12/18/2011 Document Reviewed: 01/27/2014 °ExitCare® Patient Information ©2015 [employer]. This information is not intended  to replace advice given to you by your health care provider. Make sure you discuss any questions you have with your health care provider. ° °

## 2015-01-11 NOTE — Telephone Encounter (Signed)
-----   Message from Quentin Angstlugbemiga E Jegede, MD sent at 01/08/2015  5:39 PM EDT ----- Please inform patient that his x-ray shows multilevel osteoarthritis, no fracture, no change from previous x-rays.

## 2015-01-11 NOTE — ED Provider Notes (Signed)
CSN: [id number]     Arrival date & time 01/11/15  1412 History   First MD Initiated Contact with Patient 01/11/15 1500    This chart was scribed for non-physician practitioner, Teressa LowerVrinda Angelo Caroll, NP working with Linwood DibblesJon Knapp, MD by Marica OtterNusrat Rahman, ED Scribe. This patient was seen in room TR08C/TR08C and the patient's care was started at 3:02 PM.  Chief Complaint  Patient presents with  . Back Pain   The history is provided by the patient. No language interpreter was used.   PCP: Jeanann LewandowskyJEGEDE, OLUGBEMIGA, MD  HPI Comments: Allen Hernandez is a 54 y.o. male, with PMH noted below including scoliosis and daily tobacco use, who presents to the. Emergency Department complaining of chronic mid to lower back pain and requesting pain meds for said pain. Pt rates his pain a 9 out of 10.  Pt notes that he has an appointment with a pain specialist on 4/14.   Past Medical History  Diagnosis Date  . Diabetes     recently diagnosed - 3 months ago  . Neuropathy due to secondary diabetes 08/2014   Past Surgical History  Procedure Laterality Date  . Spine surgery     Family History  Problem Relation Age of Onset  . Cancer Mother   . Cancer Father   . Diabetes Brother    History  Substance Use Topics  . Smoking status: Current Every Day Smoker -- 0.25 packs/day    Types: Cigarettes  . Smokeless tobacco: Not on file  . Alcohol Use: 0.0 oz/week    0 Standard drinks or equivalent per week     Comment: occasionally    Review of Systems  Constitutional: Negative for fever and chills.  Musculoskeletal: Positive for back pain.  Psychiatric/Behavioral: Negative for confusion.  All other systems reviewed and are negative.  Allergies  Review of patient's allergies indicates no known allergies.  Home Medications   Prior to Admission medications   Medication Sig Start Date End Date Taking? Authorizing Provider  acetaminophen-codeine (TYLENOL #4) 300-60 MG per tablet Take 1 tablet by mouth every 8 (eight)  hours as needed for moderate pain. May take 2 tablets on occasion if needed but # 90 must last one month 01/07/15   Erick ColaceAndrew E Kirsteins, MD  cyclobenzaprine (FLEXERIL) 10 MG tablet Take 1 tablet (10 mg total) by mouth 3 (three) times daily as needed for muscle spasms. Patient not taking: Reported on 01/04/2015 10/27/14   Quentin Angstlugbemiga E Jegede, MD  dapagliflozin propanediol (FARXIGA) 10 MG TABS tablet Take 10 mg by mouth daily. 01/04/15   Quentin Angstlugbemiga E Jegede, MD  gabapentin (NEURONTIN) 300 MG capsule Take 1 capsule (300 mg total) by mouth 3 (three) times daily. 01/04/15   Quentin Angstlugbemiga E Jegede, MD  glucose blood (ACCU-CHEK AVIVA) test strip Use as instructed 07/13/14   Quentin Angstlugbemiga E Jegede, MD  ibuprofen (ADVIL,MOTRIN) 400 MG tablet Take 1 tablet (400 mg total) by mouth every 6 (six) hours as needed for fever. Patient not taking: Reported on 10/27/2014 11/04/13   Quentin Angstlugbemiga E Jegede, MD  metFORMIN (GLUCOPHAGE) 500 MG tablet Take 1 tablet (500 mg total) by mouth 2 (two) times daily with a meal. 01/04/15   Quentin Angstlugbemiga E Jegede, MD  methocarbamol (ROBAXIN) 500 MG tablet Take 1 tablet (500 mg total) by mouth 4 (four) times daily. 12/21/14   Erick ColaceAndrew E Kirsteins, MD   Triage Vitals: BP 116/68 mmHg  Pulse 88  Temp(Src) 97.9 F (36.6 C)  Resp 16  Ht 5\' 9"  (1.753 m)  Wt 142 lb (64.411 kg)  BMI 20.96 kg/m2  SpO2 95% Physical Exam  Constitutional: He is oriented to person, place, and time. He appears well-developed and well-nourished. No distress.  HENT:  Head: Normocephalic and atraumatic.  Eyes: Conjunctivae and EOM are normal.  Neck: Neck supple.  Cardiovascular: Normal rate.   Pulmonary/Chest: Effort normal. No respiratory distress.  Musculoskeletal: Normal range of motion.  Moving all extremities without any problems  Neurological: He is alert and oriented to person, place, and time.  Skin: Skin is warm and dry.  Psychiatric: He has a normal mood and affect. His behavior is normal.  Nursing note and vitals  reviewed.   ED Course  Procedures (including critical care time) DIAGNOSTIC STUDIES: Oxygen Saturation is 95% on RA, adequate by my interpretation.    COORDINATION OF CARE: 3:05 PM-Discussed treatment plan with pt at bedside and pt agreed to plan.   Labs Review Labs Reviewed - No data to display  Imaging Review Dg Thoracic Spine W/swimmers  01/11/2015   CLINICAL DATA:  Idiopathic scoliosis treated with fusion, chronic pain  EXAM: THORACIC SPINE - 2 VIEW + SWIMMERS  COMPARISON:  Lumbar spine series of of January 04, 2015  FINDINGS: There is severe mid thoracic scoliosis convex toward the right. Two Harrington rods are in place. The left-sided rod extends from T5 to at L2. The right-sided rod extends from T7 -T11. The rods are intact.  IMPRESSION: There is severe dextroscoliosis of the mid thoracic spine which is been stablized with Harrington rods. There is no acute bony abnormality.   Electronically Signed   By: David  [place]   On: 01/11/2015 14:12     EKG Interpretation None      MDM   Final diagnoses:  Chronic back pain    Discussed with pt that he needs to see his pain management doctor. Pt states that his blood sugar is at baseline  I personally performed the services described in this documentation, which was scribed in my presence. The recorded information has been reviewed and is accurate.    Teressa Lower, NP 01/11/15 1513  Linwood Dibbles, MD 01/12/15 [phone]

## 2015-01-11 NOTE — ED Notes (Signed)
PT is here with mid to lower back pain that continues and had back xray today.  Pt states that he needs some pain medication because he cannot make it till he sees the specialist.  Pt has been on tylenol 3 but not working.  Pt states pain he has had forever.

## 2015-01-11 NOTE — Telephone Encounter (Signed)
Cancel thoracolumbar x ray. Changed to thoracic spine [facility]

## 2015-01-13 NOTE — Progress Notes (Signed)
Urine drug screen for this encounter is consistent for having no medication as reported. 

## 2015-01-15 ENCOUNTER — Telehealth: Payer: Self-pay | Admitting: *Deleted

## 2015-01-15 NOTE — Telephone Encounter (Signed)
Left message to return my call.  

## 2015-01-15 NOTE — Telephone Encounter (Signed)
Lavel called and is asking about his x ray results.

## 2015-01-15 NOTE — Telephone Encounter (Signed)
I spoke with Mr Allen Hernandez and notified him.

## 2015-01-15 NOTE — Telephone Encounter (Signed)
Rods are intact, no acute abnormalities Chronic severe scoliosis

## 2015-01-21 ENCOUNTER — Ambulatory Visit (HOSPITAL_BASED_OUTPATIENT_CLINIC_OR_DEPARTMENT_OTHER): Payer: No Typology Code available for payment source | Admitting: Physical Medicine & Rehabilitation

## 2015-01-21 ENCOUNTER — Encounter: Payer: Self-pay | Admitting: Physical Medicine & Rehabilitation

## 2015-01-21 ENCOUNTER — Encounter: Payer: No Typology Code available for payment source | Attending: Physical Medicine & Rehabilitation

## 2015-01-21 VITALS — BP 102/63 | HR 62 | Resp 14

## 2015-01-21 DIAGNOSIS — E119 Type 2 diabetes mellitus without complications: Secondary | ICD-10-CM | POA: Insufficient documentation

## 2015-01-21 DIAGNOSIS — M4120 Other idiopathic scoliosis, site unspecified: Secondary | ICD-10-CM | POA: Insufficient documentation

## 2015-01-21 DIAGNOSIS — M533 Sacrococcygeal disorders, not elsewhere classified: Secondary | ICD-10-CM

## 2015-01-21 DIAGNOSIS — Z5181 Encounter for therapeutic drug level monitoring: Secondary | ICD-10-CM | POA: Insufficient documentation

## 2015-01-21 DIAGNOSIS — Z79899 Other long term (current) drug therapy: Secondary | ICD-10-CM | POA: Insufficient documentation

## 2015-01-21 MED ORDER — OXYCODONE HCL 5 MG PO TABS: 5.0000 mg | ORAL_TABLET | Freq: Three times a day (TID) | ORAL | Status: DC | PRN

## 2015-01-21 NOTE — Progress Notes (Signed)

## 2015-01-21 NOTE — Progress Notes (Signed)
  PROCEDURE RECORD [facility]   Name: Allen DagoDamon C Langan DOB:06-30-1961 MRN: [id number]  Date:01/21/2015  Physician: Claudette LawsAndrew Kirsteins, MD    Nurse/CMA:Ken Markala Sitts  Allergies: No Known Allergies  Consent Signed: Yes.    Is patient diabetic? Yes.    CBG today? 274  Pregnant: No. LMP: No LMP for male patient. (age 53-55)  Anticoagulants: no Anti-inflammatory: no Antibiotics: no  Procedure: Sacroiliac steroid injectionPosition: Prone Start Time: 11:51am End Time: 11:54am  Fluoro Time:  5  RN/CMA Delfin EdisKen Janequa Kipnis Ken Shanaiya Bene    Time 11:35am 11:58am    BP 102/63 140/75    Pulse 62 72    Respirations 14 14    O2 Sat 96 100    S/S 6 6    Pain Level 8/10 3/10     D/C home with mother Talbert ForestShirley, patient A & O X 3, D/C instructions reviewed, and sits independently.

## 2015-01-21 NOTE — Patient Instructions (Signed)
Sacroiliac injection was performed today. A combination of a naming medicine plus a cortisone medicine was injected. The injection was done under x-ray guidance. This procedure has been performed to help reduce low back and buttocks pain as well as potentially hip pain. The duration of this injection is variable lasting from hours to  Months. It may repeated if needed. 

## 2015-02-19 ENCOUNTER — Other Ambulatory Visit: Payer: Self-pay | Admitting: Physical Medicine & Rehabilitation

## 2015-02-19 ENCOUNTER — Encounter: Payer: Self-pay | Admitting: Physical Medicine & Rehabilitation

## 2015-02-19 ENCOUNTER — Ambulatory Visit (HOSPITAL_BASED_OUTPATIENT_CLINIC_OR_DEPARTMENT_OTHER): Payer: Medicaid Other | Admitting: Physical Medicine & Rehabilitation

## 2015-02-19 ENCOUNTER — Encounter: Payer: Medicaid Other | Attending: Physical Medicine & Rehabilitation

## 2015-02-19 VITALS — BP 134/78 | HR 95 | Resp 14

## 2015-02-19 DIAGNOSIS — M4120 Other idiopathic scoliosis, site unspecified: Secondary | ICD-10-CM | POA: Insufficient documentation

## 2015-02-19 DIAGNOSIS — M791 Myalgia: Secondary | ICD-10-CM | POA: Diagnosis not present

## 2015-02-19 DIAGNOSIS — Z79899 Other long term (current) drug therapy: Secondary | ICD-10-CM | POA: Insufficient documentation

## 2015-02-19 DIAGNOSIS — Z5181 Encounter for therapeutic drug level monitoring: Secondary | ICD-10-CM | POA: Insufficient documentation

## 2015-02-19 DIAGNOSIS — G8929 Other chronic pain: Secondary | ICD-10-CM | POA: Diagnosis not present

## 2015-02-19 DIAGNOSIS — M533 Sacrococcygeal disorders, not elsewhere classified: Secondary | ICD-10-CM | POA: Insufficient documentation

## 2015-02-19 DIAGNOSIS — M7918 Myalgia, other site: Secondary | ICD-10-CM

## 2015-02-19 DIAGNOSIS — E119 Type 2 diabetes mellitus without complications: Secondary | ICD-10-CM | POA: Insufficient documentation

## 2015-02-19 NOTE — Patient Instructions (Signed)
Trigger Point Injection Trigger points are areas where you have muscle pain. A trigger point injection is a shot given in the trigger point to relieve that pain. A trigger point might feel like a knot in your muscle. It hurts to press on a trigger point. Sometimes the pain spreads out (radiates) to other parts of the body. For example, pressing on a trigger point in your shoulder might cause pain in your arm or neck. You might have one trigger point. Or, you might have more than one. People often have trigger points in their upper back and lower back. They also occur often in the neck and shoulders. Pain from a trigger point lasts for a long time. It can make it hard to keep moving. You might not be able to do the exercise or physical therapy that could help you deal with the pain. A trigger point injection may help. It does not work for everyone. But, it may relieve your pain for a few days or a few months. A trigger point injection does not cure long-lasting (chronic) pain. LET YOUR CAREGIVER KNOW ABOUT:  Any allergies (especially to latex, lidocaine, or steroids).  Blood-thinning medicines that you take. These drugs can lead to bleeding or bruising after an injection. They include:  Aspirin.  Ibuprofen.  Clopidogrel.  Warfarin.  Other medicines you take. This includes all vitamins, herbs, eyedrops, over-the-counter medicines, and creams.  Use of steroids.  Recent infections.  Past problems with numbing medicines.  Bleeding problems.  Surgeries you have had.  Other health problems. RISKS AND COMPLICATIONS A trigger point injection is a safe treatment. However, problems may develop, such as:  Minor side effects usually go away in 1 to 2 days. These may include:  Soreness.  Bruising.  Stiffness.  More serious problems are rare. But, they may include:  Bleeding under the skin (hematoma).  Skin infection.  Breaking off of the needle under your skin.  Lung  puncture.  The trigger point injection may not work for you. BEFORE THE PROCEDURE You may need to stop taking any medicine that thins your blood. This is to prevent bleeding and bruising. Usually these medicines are stopped several days before the injection. No other preparation is needed. PROCEDURE  A trigger point injection can be given in your caregiver's office or in a clinic. Each injection takes 2 minutes or less.  Your caregiver will feel for trigger points. The caregiver may use a marker to circle the area for the injection.  The skin over the trigger point will be washed with a germ-killing (antiseptic) solution.  The caregiver pinches the spot for the injection.  Then, a very thin needle is used for the shot. You may feel pain or a twitching feeling when the needle enters the trigger point.  A numbing solution may be injected into the trigger point. Sometimes a drug to keep down swelling, redness, and warmth (inflammation) is also injected.  Your caregiver moves the needle around the trigger zone until the tightness and twitching goes away.  After the injection, your caregiver may put gentle pressure over the injection site.  Then it is covered with a bandage. AFTER THE PROCEDURE  You can go right home after the injection.  The bandage can be taken off after a few hours.  You may feel sore and stiff for 1 to 2 days.  Go back to your regular activities slowly. Your caregiver may ask you to stretch your muscles. Do not do anything that takes   extra energy for a few days.  Follow your caregiver's instructions to manage and treat other pain. Document Released: 09/14/2011 Document Revised: 01/20/2013 Document Reviewed: 09/14/2011 ExitCare Patient Information 2015 [employer]. This information is not intended to replace advice given to you by your health care provider. Make sure you discuss any questions you have with your health care provider.  

## 2015-02-19 NOTE — Progress Notes (Signed)
   Subjective:    Patient ID: Allen Hernandez, male    DOB: 04/08/1961, 54 y.o.   MRN: [id number]  HPI Schedule lists trigger points.  Pt not aware of this, he had SI last.  No driver.  He takes [employer] and needs form returned ($20 fee).  He signed contract last time--did not bring bottle and I have found he got meds from Dr Ronne BinningMcKenzie (percocet)   Pain Inventory Average Pain 8 Pain Right Now 8 My pain is sharp, burning, dull, stabbing and aching  In the last 24 hours, has pain interfered with the following? General activity 8 Relation with others 8 Enjoyment of life 9 What TIME of day is your pain at its worst? morning daytime and night Sleep (in general) Poor  Pain is worse with: not answered Pain improves with: not answered Relief from Meds: 2  Mobility how many minutes can you walk? 5 ability to climb steps?  yes do you drive?  no  Function not employed: date last employed 2009 I need assistance with the following:  dressing and bathing  Neuro/Psych tingling dizziness  Prior Studies Any changes since last visit?  no  Physicians involved in your care Any changes since last visit?  no   Family History  Problem Relation Age of Onset  . Cancer Mother   . Cancer Father   . Diabetes Brother    History   Social History  . Marital Status: Single    Spouse Name: N/A  . Number of Children: N/A  . Years of Education: N/A   Social History Main Topics  . Smoking status: Current Every Day Smoker -- 0.25 packs/day    Types: Cigarettes  . Smokeless tobacco: Not on file  . Alcohol Use: 0.0 oz/week    0 Standard drinks or equivalent per week     Comment: occasionally  . Drug Use: No  . Sexual Activity: Yes   Other Topics Concern  . None   Social History Narrative   Past Surgical History  Procedure Laterality Date  . Spine surgery     Past Medical History  Diagnosis Date  . Diabetes     recently diagnosed - 3 months ago  . Neuropathy due to secondary  diabetes 08/2014   BP 134/78 mmHg  Pulse 95  Resp 14  SpO2 97%  Opioid Risk Score:   Fall Risk Score: Low Fall Risk (0-5 points)`1  Depression screen PHQ 2/9  [facility] 01/21/2015 01/04/2015 11/04/2013  Decreased Interest 3 0 0  Down, Depressed, Hopeless 1 0 0  PHQ - 2 Score 4 0 0  Altered sleeping 3 - -  Tired, decreased energy 2 - -  Change in appetite 2 - -  Feeling bad or failure about yourself  0 - -  Trouble concentrating 1 - -  Moving slowly or fidgety/restless 2 - -  Suicidal thoughts 0 - -  PHQ-9 Score 14 - -     Review of Systems  Constitutional: Positive for unexpected weight change.  Endocrine:       High and low blood sugars  Neurological: Positive for dizziness.       Tingling  All other systems reviewed and are negative.      Objective:   Physical Exam        Assessment & Plan:

## 2015-02-19 NOTE — Progress Notes (Signed)
54 year old male with history of lumbar and thoracic scoliosis. He underwent left sacroiliac injection last month. This was hip helpful for the left buttocks pain. He still has some right low back pain as well as pain around the left shoulder blade that is persisting.  Examination Shows no tenderness over left PSIS  There is a marked kyphoscoliosis in the thoracico lumbar area, convex to right, Healed surgical incisions midline thoraco lumbar  There is tenderness palpation around the L5-S1 paraspinal area  There is also tenderness palpation around the left scapula along the inferior medial border as well as the angle of the scapula.  Mood and affect appropriate N.r. No acute distress  Impression #1. Left sacroiliac pain which was improved after sacroiliac injection duration of response thus far is about one month, may reinject every 3 months as needed  2. Right lumbar facet syndrome: Schedule for right L3 L4 L5 lumbar medial branch blocks under fluoroscopic guidance  3. Myofascial pain left parascapular will do trigger point injection today 4. Chronic pain syndrome has violated controlled substance agreement, obtained additional oxycodone from primary care physician after signing agreement.Confirmed on [place] CSR .  We discussed that we will no longer be able to prescribe any of controlled substances for him. We can continue with his interventional pain procedures.

## 2015-02-20 LAB — PMP ALCOHOL METABOLITE (ETG): Ethyl Glucuronide (EtG): NEGATIVE ng/mL

## 2015-02-24 ENCOUNTER — Other Ambulatory Visit: Payer: Self-pay | Admitting: *Deleted

## 2015-02-25 LAB — COCAINE METABOLITE (GC/LC/MS), URINE: Benzoylecgonine GC/MS Conf: 350 ng/mL — AB

## 2015-02-26 LAB — PRESCRIPTION MONITORING PROFILE (SOLSTAS)
AMPHETAMINE/METH: NEGATIVE ng/mL
BENZODIAZEPINE SCREEN, URINE: NEGATIVE ng/mL
BUPRENORPHINE, URINE: NEGATIVE ng/mL
Barbiturate Screen, Urine: NEGATIVE ng/mL
Cannabinoid Scrn, Ur: NEGATIVE ng/mL
Carisoprodol, Urine: NEGATIVE ng/mL
Creatinine, Urine: 167.68 mg/dL (ref >20.0)
Fentanyl, Ur: NEGATIVE ng/mL
MDMA URINE: NEGATIVE ng/mL
MEPERIDINE UR: NEGATIVE ng/mL
Methadone Screen, Urine: NEGATIVE ng/mL
Nitrites, Initial: NEGATIVE ug/mL
OXYCODONE SCRN UR: NEGATIVE ng/mL
Opiate Screen, Urine: NEGATIVE ng/mL
Propoxyphene: NEGATIVE ng/mL
Tapentadol, urine: NEGATIVE ng/mL
Tramadol Scrn, Ur: NEGATIVE ng/mL
ZOLPIDEM, URINE: NEGATIVE ng/mL
pH, Initial: 7.9 pH (ref 4.5–8.9)

## 2015-03-10 NOTE — Progress Notes (Signed)
Urine drug screen for this encounter is inconsistent. Positive for COCAINE.  We are no longer prescribing due to violation of CSA by obtaining oxycodone from another physician so this does not change outcome--non narcotic status from now on.

## 2015-03-25 ENCOUNTER — Ambulatory Visit: Payer: No Typology Code available for payment source | Admitting: Physical Medicine & Rehabilitation

## 2015-04-19 ENCOUNTER — Ambulatory Visit: Payer: Self-pay | Admitting: Internal Medicine

## 2015-04-22 ENCOUNTER — Ambulatory Visit: Payer: Medicaid Other | Attending: Internal Medicine | Admitting: Internal Medicine

## 2016-03-02 ENCOUNTER — Ambulatory Visit: Payer: Medicaid Other | Admitting: Physical Medicine & Rehabilitation

## 2016-03-23 ENCOUNTER — Ambulatory Visit: Payer: Medicaid Other | Admitting: Physical Medicine & Rehabilitation

## 2016-03-28 ENCOUNTER — Ambulatory Visit: Payer: Medicaid Other | Admitting: Physical Medicine & Rehabilitation

## 2016-03-28 ENCOUNTER — Encounter: Payer: Medicaid Other | Attending: Physical Medicine & Rehabilitation

## 2016-06-17 ENCOUNTER — Emergency Department (HOSPITAL_COMMUNITY)
Admission: EM | Admit: 2016-06-17 | Discharge: 2016-06-17 | Disposition: A | Discharge status: Home / Self Care | Payer: Medicaid Other | Attending: Emergency Medicine | Admitting: Emergency Medicine

## 2016-06-17 ENCOUNTER — Encounter (HOSPITAL_COMMUNITY): Payer: Self-pay

## 2016-06-17 ENCOUNTER — Emergency Department (HOSPITAL_COMMUNITY): Payer: Medicaid Other

## 2016-06-17 DIAGNOSIS — Z7984 Long term (current) use of oral hypoglycemic drugs: Secondary | ICD-10-CM | POA: Diagnosis not present

## 2016-06-17 DIAGNOSIS — F17210 Nicotine dependence, cigarettes, uncomplicated: Secondary | ICD-10-CM | POA: Insufficient documentation

## 2016-06-17 DIAGNOSIS — Y92410 Unspecified street and highway as the place of occurrence of the external cause: Secondary | ICD-10-CM | POA: Insufficient documentation

## 2016-06-17 DIAGNOSIS — E1140 Type 2 diabetes mellitus with diabetic neuropathy, unspecified: Secondary | ICD-10-CM | POA: Insufficient documentation

## 2016-06-17 DIAGNOSIS — Y999 Unspecified external cause status: Secondary | ICD-10-CM | POA: Insufficient documentation

## 2016-06-17 DIAGNOSIS — Y9301 Activity, walking, marching and hiking: Secondary | ICD-10-CM | POA: Diagnosis not present

## 2016-06-17 DIAGNOSIS — M25562 Pain in left knee: Secondary | ICD-10-CM | POA: Diagnosis not present

## 2016-06-17 DIAGNOSIS — S0990XA Unspecified injury of head, initial encounter: Secondary | ICD-10-CM | POA: Insufficient documentation

## 2016-06-17 MED ORDER — OXYCODONE-ACETAMINOPHEN 5-325 MG PO TABS
2.0000 | ORAL_TABLET | Freq: Once | ORAL | Status: AC
  Administered 2016-06-17: 2 via ORAL
  Filled 2016-06-17: qty 2

## 2016-06-17 NOTE — Discharge Instructions (Signed)
You were seen today  for left knee pain and head pain. Your CT of your head and x-ray of your knee were negative for any acute abnormalities. Follow-up with your primary care physician or the [facility] in 3 days if your symptoms are not improving or worsening. Take Tylenol or ibuprofen as needed for pain. Ice your head and your knee as often as you can, 20 minutes on, 20 minutes off and be sure to keep a thin cloth between your skin and the ice.  Return to the emergency department if you experience headaches, dizziness, visual changes, nausea, vomiting, abdominal pain, blood in your urine, you pass out, or any other concerning symptoms.
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
## 2016-06-19 ENCOUNTER — Encounter (HOSPITAL_COMMUNITY): Payer: Self-pay | Admitting: Emergency Medicine

## 2016-06-19 ENCOUNTER — Emergency Department (HOSPITAL_COMMUNITY)
Admission: EM | Admit: 2016-06-19 | Discharge: 2016-06-20 | Disposition: A | Discharge status: Home / Self Care | Payer: Medicaid Other | Attending: Emergency Medicine | Admitting: Emergency Medicine

## 2016-06-19 DIAGNOSIS — S8012XD Contusion of left lower leg, subsequent encounter: Secondary | ICD-10-CM | POA: Diagnosis not present

## 2016-06-19 DIAGNOSIS — F17210 Nicotine dependence, cigarettes, uncomplicated: Secondary | ICD-10-CM | POA: Insufficient documentation

## 2016-06-19 DIAGNOSIS — E1140 Type 2 diabetes mellitus with diabetic neuropathy, unspecified: Secondary | ICD-10-CM | POA: Insufficient documentation

## 2016-06-19 DIAGNOSIS — Z7984 Long term (current) use of oral hypoglycemic drugs: Secondary | ICD-10-CM | POA: Diagnosis not present

## 2016-06-19 MED ORDER — HYDROCODONE-ACETAMINOPHEN 5-325 MG PO TABS
1.0000 | ORAL_TABLET | Freq: Once | ORAL | Status: AC
  Administered 2016-06-20: 1 via ORAL
  Filled 2016-06-19: qty 1
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
## 2016-06-20 NOTE — Discharge Instructions (Signed)
Fill the prescriptions you received at your last visit and take as prescribed. Follow up with Dr. Eulah PontMurphy as needed. Return to the ER for new or worsening symptoms.

## 2016-09-23 ENCOUNTER — Emergency Department (HOSPITAL_COMMUNITY): Payer: Medicaid Other

## 2016-09-23 ENCOUNTER — Emergency Department (HOSPITAL_COMMUNITY)
Admission: EM | Admit: 2016-09-23 | Discharge: 2016-09-23 | Disposition: A | Discharge status: Home / Self Care | Payer: Medicaid Other | Attending: Emergency Medicine | Admitting: Emergency Medicine

## 2016-09-23 ENCOUNTER — Encounter (HOSPITAL_COMMUNITY): Payer: Self-pay

## 2016-09-23 DIAGNOSIS — G894 Chronic pain syndrome: Secondary | ICD-10-CM | POA: Diagnosis not present

## 2016-09-23 DIAGNOSIS — R0602 Shortness of breath: Secondary | ICD-10-CM | POA: Insufficient documentation

## 2016-09-23 DIAGNOSIS — L89819 Pressure ulcer of head, unspecified stage: Secondary | ICD-10-CM | POA: Diagnosis not present

## 2016-09-23 DIAGNOSIS — Z7984 Long term (current) use of oral hypoglycemic drugs: Secondary | ICD-10-CM | POA: Insufficient documentation

## 2016-09-23 DIAGNOSIS — F17210 Nicotine dependence, cigarettes, uncomplicated: Secondary | ICD-10-CM | POA: Diagnosis not present

## 2016-09-23 DIAGNOSIS — E1140 Type 2 diabetes mellitus with diabetic neuropathy, unspecified: Secondary | ICD-10-CM | POA: Insufficient documentation

## 2016-09-23 LAB — BASIC METABOLIC PANEL
ANION GAP: 5 (ref 5–15)
BUN: 10 mg/dL (ref 6–20)
CHLORIDE: 106 mmol/L (ref 101–111)
CO2: 25 mmol/L (ref 22–32)
CREATININE: 1.17 mg/dL (ref 0.61–1.24)
Calcium: 9 mg/dL (ref 8.9–10.3)
GFR calc non Af Amer: >60 mL/min (ref >60)
Glucose, Bld: 209 mg/dL — ABNORMAL HIGH (ref 65–99)
POTASSIUM: 3.9 mmol/L (ref 3.5–5.1)
Sodium: 136 mmol/L (ref 135–145)

## 2016-09-23 LAB — CBC WITH DIFFERENTIAL/PLATELET
Basophils Absolute: 0 10*3/uL (ref 0.0–0.1)
Basophils Relative: 0 %
Eosinophils Absolute: 0.2 10*3/uL (ref 0.0–0.7)
Eosinophils Relative: 3 %
HEMATOCRIT: 31.2 % — AB (ref 39.0–52.0)
HEMOGLOBIN: 10 g/dL — AB (ref 13.0–17.0)
LYMPHS ABS: 1.4 10*3/uL (ref 0.7–4.0)
LYMPHS PCT: 25 %
MCH: 28.5 pg (ref 26.0–34.0)
MCHC: 32.1 g/dL (ref 30.0–36.0)
MCV: 88.9 fL (ref 78.0–100.0)
MONOS PCT: 10 %
Monocytes Absolute: 0.6 10*3/uL (ref 0.1–1.0)
NEUTROS ABS: 3.3 10*3/uL (ref 1.7–7.7)
NEUTROS PCT: 62 %
Platelets: 368 10*3/uL (ref 150–400)
RBC: 3.51 MIL/uL — ABNORMAL LOW (ref 4.22–5.81)
RDW: 15.8 % — ABNORMAL HIGH (ref 11.5–15.5)
WBC: 5.5 10*3/uL (ref 4.0–10.5)

## 2016-09-23 MED ORDER — AZITHROMYCIN 250 MG PO TABS: 250.0000 mg | ORAL_TABLET | Freq: Every day | ORAL | 0 refills | Status: DC

## 2016-09-23 MED ORDER — METHOCARBAMOL 500 MG PO TABS: 500.0000 mg | ORAL_TABLET | Freq: Every evening | ORAL | 0 refills | Status: DC | PRN

## 2016-09-23 NOTE — Care Management Note (Signed)
Case Management Note  Patient Details  Name: Allen Hernandez MRN: [id number] Date of Birth: 1961/06/05  Subjective/Objective:  55 y.o. M seen in the ED today for Chronic myofascial pAIN. PT HAS BEEN A PT OF [facility] BUT HAS NO DOCUMENTED VISITS SINCE 01-04-15. CM sent inbasket message to Allen CalJ Brazeau RN, Care Coordinator to resume contact with this pt. Pt tells me his PCP is Billee CashingWayland Mckenzie, MD. Updated EMR.                   Action/Plan: CM will sign off for now but will be available should additional discharge needs arise or disposition change.    Expected Discharge Date:                  Expected Discharge Plan:  Home/Self Care  In-House Referral:  NA  Discharge planning Services  CM Consult, [facility] ([facility] and Tamera PuntWMckenzie, MD per [facility])  Post Acute Care Choice:  NA Choice offered to:  Patient  DME Arranged:  N/A DME Agency:  NA  HH Arranged:  NA HH Agency:  NA  Status of Service:  Completed, signed off  If discussed at Long Length of Stay Meetings, dates discussed:    Additional Comments:  Yvone NeuCrutchfield, Mele Sylvester M, RN 09/23/2016, 2:43 PM

## 2016-09-23 NOTE — ED Notes (Signed)
Pt. Transferred to X-ray 

## 2016-09-23 NOTE — ED Notes (Signed)
Hannie, EMT printed off EKG<  This RN gave EKG to Terance HartKelly Gekas, PA

## 2016-09-23 NOTE — ED Provider Notes (Signed)
[facility] DEPT Provider Note   CSN: [id number] Arrival date & time: 09/23/16  1125   History   Chief Complaint Chief Complaint  Patient presents with  . Shortness of Breath    HPI Allen Hernandez is a 55 y.o. male who presents with chest pain and SOB. PMH significant for recent PNA, chronic pain, non-insulin dependent DM. Poor historian. He states he was hospitalized for 2 months at [facility] for pneumonia and was "on his death bed". He was placed on dialysis while in the hospital due to severe AKI. On review of EMR, I am unable to see any notes but can see labs that were done. It appears he was hospitalized at [facility] from 10/[facility]-11/30 for ARDS and had bronchial lavage. He states he got out a week and a half ago. Has not followed up with his PCP yet. He reports fever, chills, SOB, productive cough, chest pain over the right lower lobe, back pain, neuropathy in his legs. He lives with his mother.  HPI  Past Medical History:  Diagnosis Date  . Diabetes (HCC)    recently diagnosed - 3 months ago  . Neuropathy due to secondary diabetes (HCC) 08/2014    Patient Active Problem List   Diagnosis Date Noted  . Chronic myofascial pain 02/19/2015  . 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 Type 2 diabetes mellitus without complication (HCC) 07/13/2014  . Scoliosis (and kyphoscoliosis), idiopathic 11/04/2013  . Low back pain 11/04/2013    Past Surgical History:  Procedure Laterality Date  . SPINE SURGERY         Home Medications    Prior to Admission medications   Medication Sig Start Date End Date Taking? Authorizing Provider  gabapentin (NEURONTIN) 100 MG capsule Take 100 mg by mouth at bedtime. 09/07/16 09/07/17 Yes Historical Provider, MD  gabapentin (NEURONTIN) 300 MG capsule Take 1 capsule (300 mg total) by mouth 3 (three) times daily. Patient taking differently: Take 300 mg by mouth at bedtime.  01/04/15  Yes Olugbemiga E Hyman HopesJegede, MD  glipiZIDE (GLUCOTROL) 5 MG tablet Take 2.5 mg  by mouth daily. 09/07/16  Yes Historical Provider, MD  ibuprofen (ADVIL,MOTRIN) 400 MG tablet Take 1 tablet (400 mg total) by mouth every 6 (six) hours as needed for fever. 11/04/13  Yes Quentin Angstlugbemiga E Jegede, MD  omeprazole (PRILOSEC) 20 MG capsule Take 20 mg by mouth daily.   Yes Historical Provider, MD  oxyCODONE (ROXICODONE) 15 MG immediate release tablet Take 15 mg by mouth every 6 (six) hours as needed. 09/08/16  Yes Historical Provider, MD  cyclobenzaprine (FLEXERIL) 10 MG tablet Take 1 tablet (10 mg total) by mouth 3 (three) times daily as needed for muscle spasms. Patient not taking: Reported on 09/23/2016 10/27/14   Quentin Angstlugbemiga E Jegede, MD  dapagliflozin propanediol (FARXIGA) 10 MG TABS tablet Take 10 mg by mouth daily. Patient not taking: Reported on 09/23/2016 01/04/15   Quentin Angstlugbemiga E Jegede, MD  metFORMIN (GLUCOPHAGE) 500 MG tablet Take 1 tablet (500 mg total) by mouth 2 (two) times daily with a meal. Patient not taking: Reported on 09/23/2016 01/04/15   Quentin Angstlugbemiga E Jegede, MD  methocarbamol (ROBAXIN) 500 MG tablet Take 1 tablet (500 mg total) by mouth 4 (four) times daily. Patient not taking: Reported on 09/23/2016 12/21/14   Erick ColaceAndrew E Kirsteins, MD  oxyCODONE (OXY IR/ROXICODONE) 5 MG immediate release tablet Take 1 tablet (5 mg total) by mouth 3 (three) times daily as needed for severe pain. Patient not taking: Reported on 09/23/2016 01/21/15  Erick Colace, MD    Family History Family History  Problem Relation Age of Onset  . Cancer Mother   . Cancer Father   . Diabetes Brother     Social History Social History  Substance Use Topics  . Smoking status: Current Every Day Smoker    Packs/day: 0.25    Types: Cigarettes  . Smokeless tobacco: Never Used  . Alcohol use 0.0 oz/week     Comment: occasionally     Allergies   Patient has no known allergies.   Review of Systems Review of Systems  Constitutional: Positive for chills and fever.  Respiratory: Positive for  shortness of breath. Negative for cough and wheezing.   Cardiovascular: Positive for chest pain. Negative for palpitations and leg swelling.  Gastrointestinal: Positive for abdominal pain, nausea and vomiting. Negative for diarrhea.  Genitourinary: Negative for dysuria.  Musculoskeletal: Positive for back pain and gait problem.  All other systems reviewed and are negative.    Physical Exam Updated Vital Signs BP (!) 159/105 (BP Location: Right Arm)   Pulse 102   Temp 98.2 F (36.8 C) (Oral)   Resp 18   Ht 5\' 8"  (1.727 m)   Wt 54.4 kg   SpO2 100%   BMI 18.25 kg/m   Physical Exam  Constitutional: He is oriented to person, place, and time. No distress.  Chronically ill appearing  HENT:  Head: Normocephalic and atraumatic.  4-2 pressure ulcer noted on posterior scalp. No redness, tenderness or drainage.  Eyes: Conjunctivae are normal. Pupils are equal, round, and reactive to light. Right eye exhibits no discharge. Left eye exhibits no discharge. No scleral icterus.  Neck: Normal range of motion.  Cardiovascular: Normal rate and regular rhythm.  Exam reveals no gallop and no friction rub.   No murmur heard. Pulmonary/Chest: Effort normal. No respiratory distress. He has no wheezes. He has rhonchi. He has no rales. He exhibits no tenderness.  Diffuse rhonchi  Abdominal: He exhibits no distension.  Neurological: He is alert and oriented to person, place, and time.  Skin: Skin is warm and dry. He is not diaphoretic.  Sacral pressure ulcer noted. No redness, tenderness, or drainage  Psychiatric: He has a normal mood and affect. His behavior is normal.  Nursing note and vitals reviewed.    ED Treatments / Results  Labs (all labs ordered are listed, but only abnormal results are displayed) Labs Reviewed  BASIC METABOLIC PANEL - Abnormal; Notable for the following:       Result Value   Glucose, Bld 209 (*)    All other components within normal limits  CBC WITH  DIFFERENTIAL/PLATELET - Abnormal; Notable for the following:    RBC 3.51 (*)    Hemoglobin 10.0 (*)    HCT 31.2 (*)    RDW 15.8 (*)    All other components within normal limits  I-STAT TROPOININ, ED    EKG  EKG Interpretation  Date/Time:  Saturday September 23 2016 13:39:16 EST Ventricular Rate:  82 PR Interval:    QRS Duration: 76 QT Interval:  405 QTC Calculation: 473 R Axis:   70 Text Interpretation:  Sinus rhythm Probable left atrial enlargement No significant change since last tracing Confirmed by Erroll Luna [phone]) on 09/25/2016 12:07:26 AM       Radiology Dg Chest 2 View  Result Date: 09/23/2016 CLINICAL DATA:  Shortness of breath, chest pain EXAM: CHEST  2 VIEW COMPARISON:  01/11/2015 FINDINGS: Cardiomediastinal silhouette is stable. Again noted dextroscoliosis  and metallic fixation rods thoracic spine. There is streaky atelectasis or early infiltrate in right upper lobe. No pulmonary edema. IMPRESSION: Streaky atelectasis or infiltrate in right upper lobe. No pulmonary edema. Electronically Signed   By: Natasha MeadLiviu  Pop M.D.   On: 09/23/2016 13:25    Procedures Procedures (including critical care time)  Medications Ordered in ED Medications - No data to display   Initial Impression / Assessment and Plan / ED Course  I have reviewed the triage vital signs and the nursing notes.  Pertinent labs & imaging results that were available during my care of the patient were reviewed by me and considered in my medical decision making (see chart for details).  Clinical Course    55 year old male with SOB most likely from residual PNA. He is hypertensive but otherwise afebrile and not tachycardic. No hypoxia or tachypnea. CBC remarkable for anemia. BMP remarkable for glucose of 209. EKG is SR. CXR shows infiltrate in RUL. Unclear if he is supposed to be on antibiotics. Will rx Z-pack and encouraged follow up with PCP. Patient verbalized understanding.  He is also highly  concerned with his chronic pain. On review of [facility] patient has filled Oxycodone 15mg  #120 (30 day supply) on 12/1. Will not refill any controlled medicines today but did offer muscle relaxer which he agreed to.  Final Clinical Impressions(s) / ED Diagnoses   Final diagnoses:  Shortness of breath  Chronic pain syndrome    New Prescriptions Discharge Medication List as of 09/23/2016  3:26 PM    START taking these medications   Details  azithromycin (ZITHROMAX) 250 MG tablet Take 1 tablet (250 mg total) by mouth daily. Take first 2 tablets together, then 1 every day until finished., Starting Sat 09/23/2016, Print         Bethel BornKelly Marie Sarabi Sockwell, PA-C 09/25/16 1027    Azalia BilisKevin Campos, MD 09/28/16 1041

## 2016-09-23 NOTE — ED Notes (Signed)
Pt. Ate 100% of lunch.  

## 2016-09-23 NOTE — ED Notes (Signed)
Pt. Returned from X-ray, lab at the bedside .  Pt. Has requested to eat lunch.

## 2016-09-23 NOTE — ED Triage Notes (Addendum)
Pt. Was diagnosed with pneumonia at [facility] and was hospitalized for 2 months.

## 2016-09-23 NOTE — ED Notes (Signed)
Applied curlex around pt's head and over dressed wound

## 2016-09-23 NOTE — ED Notes (Signed)
Lunch tray ordered for pt.

## 2016-09-26 ENCOUNTER — Telehealth: Payer: Self-pay

## 2016-09-26 NOTE — Telephone Encounter (Signed)
Message received from Marianna FussJeannie Crutchfield, RN CM requesting a hospital follow up appointment at [facility] for the patient. Call placed to the patient and he said that he has an appointment scheduled with Dr Rutherford LimerickMcKenzie,PCP,  on 10/11/16 but will call [facility] after that if he decides to change PCPs.  Update provided to J. Cruthcfield, RN CM

## 2016-11-14 ENCOUNTER — Ambulatory Visit: Payer: Medicaid Other | Admitting: Physical Medicine & Rehabilitation

## 2016-11-14 ENCOUNTER — Encounter: Payer: Self-pay | Attending: Physical Medicine & Rehabilitation

## 2017-02-07 ENCOUNTER — Emergency Department (HOSPITAL_COMMUNITY): Payer: Medicaid Other

## 2017-02-07 ENCOUNTER — Encounter (HOSPITAL_COMMUNITY): Payer: Self-pay | Admitting: *Deleted

## 2017-02-07 ENCOUNTER — Emergency Department (HOSPITAL_COMMUNITY)
Admission: EM | Admit: 2017-02-07 | Discharge: 2017-02-07 | Disposition: A | Discharge status: Home / Self Care | Payer: Medicaid Other | Attending: Emergency Medicine | Admitting: Emergency Medicine

## 2017-02-07 DIAGNOSIS — M62830 Muscle spasm of back: Secondary | ICD-10-CM | POA: Insufficient documentation

## 2017-02-07 DIAGNOSIS — F17210 Nicotine dependence, cigarettes, uncomplicated: Secondary | ICD-10-CM | POA: Insufficient documentation

## 2017-02-07 DIAGNOSIS — M545 Low back pain: Secondary | ICD-10-CM | POA: Diagnosis present

## 2017-02-07 DIAGNOSIS — Z7984 Long term (current) use of oral hypoglycemic drugs: Secondary | ICD-10-CM | POA: Insufficient documentation

## 2017-02-07 DIAGNOSIS — G8929 Other chronic pain: Secondary | ICD-10-CM | POA: Diagnosis not present

## 2017-02-07 DIAGNOSIS — E1140 Type 2 diabetes mellitus with diabetic neuropathy, unspecified: Secondary | ICD-10-CM | POA: Insufficient documentation

## 2017-02-07 HISTORY — DX: Pneumonia, unspecified organism: J18.9

## 2017-02-07 HISTORY — DX: Disorder of kidney and ureter, unspecified: N28.9

## 2017-02-07 HISTORY — DX: Respiratory failure, unspecified, unspecified whether with hypoxia or hypercapnia: J96.90

## 2017-02-07 MED ORDER — CYCLOBENZAPRINE HCL 10 MG PO TABS: 10.0000 mg | ORAL_TABLET | Freq: Two times a day (BID) | ORAL | 0 refills | Status: DC | PRN

## 2017-02-07 MED ORDER — OXYCODONE-ACETAMINOPHEN 5-325 MG PO TABS
1.0000 | ORAL_TABLET | Freq: Once | ORAL | Status: AC
  Administered 2017-02-07: 1 via ORAL
  Filled 2017-02-07: qty 1

## 2017-02-07 NOTE — ED Triage Notes (Signed)
Pt reports being at work last night and onset of lower back pain. Hx of scoliosis. Ambulatory at triage.

## 2017-02-07 NOTE — ED Provider Notes (Signed)
[facility]PT Provider Note    By signing my name below, I, Earmon Phoenix, attest that this documentation has been prepared under the direction and in the presence of [facility], [place]. Electronically Signed: Earmon Phoenix, ED Scribe. 02/07/17. 10:37 PM    History   Chief Complaint Chief Complaint  Patient presents with  . Back Pain   The history is provided by the patient and medical records. No language interpreter was used.    Allen Hernandez is a 56 y.o. male with PMHx of T2DM, renal disorder, sacroiliac dysfunction, scoliosis and chronic low back pain who presents to the Emergency Department complaining of low back pain that began yesterday at work. He reports associated left wrist pain and states he heard a popping sensation. He reports repeatedly lifting heavy items at work last night. He states he takes daily medications for his back pain. Movements increase the pain. He denies alleviating factors. He denies fever, chills, nausea, vomiting, bowel or bladder incontinence, numbness, tingling or weakness of any extremity. He states he has had surgery in the past with hardware placement to repair his scoliosis. He states he is followed by Dr. Ronne Binning for his back problems and states he has an appt with him in two days.   Past Medical History:  Diagnosis Date  . Diabetes (HCC)    recently diagnosed - 3 months ago  . Neuropathy due to secondary diabetes (HCC) 08/2014  . Pneumonia   . Renal disorder   . Respiratory failure [facility])     Patient Active Problem List   Diagnosis Date Noted  . Chronic myofascial pain 02/19/2015  . 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 Type 2 diabetes mellitus without complication (HCC) 07/13/2014  . Scoliosis (and kyphoscoliosis), idiopathic 11/04/2013  . Low back pain 11/04/2013    Past Surgical History:  Procedure Laterality Date  . SPINE SURGERY         Home Medications    Prior to Admission medications   Medication Sig Start  Date End Date Taking? Authorizing Provider  azithromycin (ZITHROMAX) 250 MG tablet Take 1 tablet (250 mg total) by mouth daily. Take first 2 tablets together, then 1 every day until finished. 09/23/16   Bethel Born, PA-C  cyclobenzaprine (FLEXERIL) 10 MG tablet Take 1 tablet (10 mg total) by mouth 2 (two) times daily as needed for muscle spasms. 02/07/17   Azarel Banner Orlene Och, NP  dapagliflozin propanediol (FARXIGA) 10 MG TABS tablet Take 10 mg by mouth daily. Patient not taking: Reported on 09/23/2016 01/04/15   Quentin Angst, MD  gabapentin (NEURONTIN) 100 MG capsule Take 100 mg by mouth at bedtime. 09/07/16 09/07/17  Historical Provider, MD  gabapentin (NEURONTIN) 300 MG capsule Take 1 capsule (300 mg total) by mouth 3 (three) times daily. Patient taking differently: Take 300 mg by mouth at bedtime.  01/04/15   Olugbemiga Annitta Needs, MD  glipiZIDE (GLUCOTROL) 5 MG tablet Take 2.5 mg by mouth daily. 09/07/16   Historical Provider, MD  ibuprofen (ADVIL,MOTRIN) 400 MG tablet Take 1 tablet (400 mg total) by mouth every 6 (six) hours as needed for fever. 11/04/13   Quentin Angst, MD  metFORMIN (GLUCOPHAGE) 500 MG tablet Take 1 tablet (500 mg total) by mouth 2 (two) times daily with a meal. Patient not taking: Reported on 09/23/2016 01/04/15   Quentin Angst, MD  methocarbamol (ROBAXIN) 500 MG tablet Take 1 tablet (500 mg total) by mouth at bedtime and may repeat dose one time if needed. 09/23/16  Bethel Born, PA-C  omeprazole (PRILOSEC) 20 MG capsule Take 20 mg by mouth daily.    Historical Provider, MD  oxyCODONE (ROXICODONE) 15 MG immediate release tablet Take 15 mg by mouth every 6 (six) hours as needed. 09/08/16   Historical Provider, MD    Family History Family History  Problem Relation Age of Onset  . Cancer Mother   . Cancer Father   . Diabetes Brother     Social History Social History  Substance Use Topics  . Smoking status: Current Every Day Smoker    Packs/day: 0.25     Types: Cigarettes  . Smokeless tobacco: Never Used  . Alcohol use 0.0 oz/week     Comment: occasionally     Allergies   Patient has no known allergies.   Review of Systems Review of Systems  Gastrointestinal:       No bowel or bladder incontinence  Musculoskeletal: Positive for back pain.  Neurological: Negative for weakness and numbness.     Physical Exam Updated Vital Signs BP (!) 122/105 (BP Location: Right Arm)   Pulse 80   Temp 98.4 F (36.9 C)   Resp 16   SpO2 98%   Physical Exam  Constitutional: He is oriented to person, place, and time. He appears well-developed and well-nourished. No distress.  HENT:  Head: Normocephalic and atraumatic.  Eyes: EOM are normal. Pupils are equal, round, and reactive to light.  Neck: Normal range of motion. Neck supple.  Cardiovascular: Normal rate and regular rhythm.   Radial pulses 2+ bilaterally. Adequate circulation.  Pulmonary/Chest: Effort normal and breath sounds normal.  Abdominal: Soft. Bowel sounds are normal. There is no tenderness. There is no CVA tenderness.  Musculoskeletal: Normal range of motion. He exhibits no edema.       Lumbar back: He exhibits tenderness. He exhibits normal range of motion, no deformity, no spasm and normal pulse.  Severe scoliosis. Scar noted to the midline where pt has had surgery with a rod placed for scoliosis. Tenderness to palpation to the lower lumbar area. Limited ROM due to pain and deformity.  Neurological: He is alert and oriented to person, place, and time. He has normal strength. No cranial nerve deficit or sensory deficit. Gait normal.  Reflex Scores:      Bicep reflexes are 2+ on the right side and 2+ on the left side.      Brachioradialis reflexes are 2+ on the right side and 2+ on the left side.      Patellar reflexes are 2+ on the right side and 2+ on the left side.      Achilles reflexes are 2+ on the right side and 2+ on the left side. Skin: Skin is warm and dry.    Psychiatric: He has a normal mood and affect. His behavior is normal.  Nursing note and vitals reviewed.    ED Treatments / Results  DIAGNOSTIC STUDIES: Oxygen Saturation is 98% on RA, normal by my interpretation.   COORDINATION OF CARE: 9:19 PM- Will order lumbar spine X-Ray. Will order pain medication. Pt verbalizes understanding and agrees to plan.  10:35 PM- Will prescribe pain medication and muscle relaxer and encouraged pt to follow up with his doctor at appt he already has scheduled.   Medications  oxyCODONE-acetaminophen (PERCOCET/ROXICET) 5-325 MG per tablet 1 tablet (1 tablet Oral Given 02/07/17 2151)    Labs (all labs ordered are listed, but only abnormal results are displayed) Labs Reviewed - No data to display  Radiology Dg Lumbar Spine Complete  Result Date: 02/07/2017 CLINICAL DATA:  Sudden onset of lower back pain last night. EXAM: LUMBAR SPINE - COMPLETE 4+ VIEW COMPARISON:  01/04/2015 FINDINGS: Marked scoliosis. Visible portions of the spinal fixation hardware are intact. The lumbar vertebrae are normal in height. No evidence of an acute fracture. Mild-to-moderate degenerative lumbar disc changes are present. No bone lesion or bony destruction. IMPRESSION: Scoliosis and spinal fixation hardware. Mild-to-moderate degenerative disc changes. No acute findings. Electronically Signed   By: Ellery Plunk M.D.   On: 02/07/2017 22:08    Procedures Procedures (including critical care time)  Medications Ordered in ED Medications  oxyCODONE-acetaminophen (PERCOCET/ROXICET) 5-325 MG per tablet 1 tablet (1 tablet Oral Given 02/07/17 2151)     Initial Impression / Assessment and Plan / ED Course  I have reviewed the triage vital signs and the nursing notes.  Pertinent imaging results that were available during my care of the patient were reviewed by me and considered in my medical decision making (see chart for details).   Patient with exacerbation of chronic low back  pain that began last night while at work. No neurological deficits Patient is ambulatory.  No loss of bowel or bladder control. No concern for cauda equina. No fever, night sweats, weight loss, h/o cancer, IVDA, no recent procedure to back. No urinary symptoms suggestive of UTI. X-rays negative for acute findings. Supportive care and return precaution discussed. Appears safe for discharge at this time. Follow up as indicated in discharge paperwork.    Final Clinical Impressions(s) / ED Diagnoses   Final diagnoses:  Muscle spasm of back  Acute exacerbation of chronic low back pain    New Prescriptions Discharge Medication List as of 02/07/2017 10:40 PM    START taking these medications   Details  cyclobenzaprine (FLEXERIL) 10 MG tablet Take 1 tablet (10 mg total) by mouth 2 (two) times daily as needed for muscle spasms., Starting Wed 02/07/2017, Print      I personally performed the services described in this documentation, which was scribed in my presence. The recorded information has been reviewed and is accurate.    [address] [place], [place] 02/08/17 1619    Mancel Bale, MD 02/08/17 [phone]

## 2017-02-07 NOTE — ED Notes (Signed)
Patient left at this time with all belongings. 

## 2017-02-07 NOTE — ED Notes (Signed)
Pt also concerned about L wrist, wants it checks. Wants hemoglobin A1C checked.

## 2017-02-16 ENCOUNTER — Emergency Department (HOSPITAL_COMMUNITY): Payer: Medicaid Other

## 2017-02-16 ENCOUNTER — Emergency Department (HOSPITAL_COMMUNITY)
Admission: EM | Admit: 2017-02-16 | Discharge: 2017-02-16 | Disposition: A | Discharge status: Home / Self Care | Payer: Medicaid Other | Attending: Emergency Medicine | Admitting: Emergency Medicine

## 2017-02-16 ENCOUNTER — Encounter (HOSPITAL_COMMUNITY): Payer: Self-pay | Admitting: Emergency Medicine

## 2017-02-16 DIAGNOSIS — Y999 Unspecified external cause status: Secondary | ICD-10-CM | POA: Diagnosis not present

## 2017-02-16 DIAGNOSIS — E1140 Type 2 diabetes mellitus with diabetic neuropathy, unspecified: Secondary | ICD-10-CM | POA: Insufficient documentation

## 2017-02-16 DIAGNOSIS — R4020 Unspecified coma: Secondary | ICD-10-CM

## 2017-02-16 DIAGNOSIS — G8929 Other chronic pain: Secondary | ICD-10-CM | POA: Diagnosis not present

## 2017-02-16 DIAGNOSIS — R55 Syncope and collapse: Secondary | ICD-10-CM | POA: Diagnosis not present

## 2017-02-16 DIAGNOSIS — M546 Pain in thoracic spine: Secondary | ICD-10-CM | POA: Insufficient documentation

## 2017-02-16 DIAGNOSIS — Z79899 Other long term (current) drug therapy: Secondary | ICD-10-CM | POA: Insufficient documentation

## 2017-02-16 DIAGNOSIS — Y939 Activity, unspecified: Secondary | ICD-10-CM | POA: Insufficient documentation

## 2017-02-16 DIAGNOSIS — S0990XA Unspecified injury of head, initial encounter: Secondary | ICD-10-CM | POA: Diagnosis present

## 2017-02-16 DIAGNOSIS — Y929 Unspecified place or not applicable: Secondary | ICD-10-CM | POA: Diagnosis not present

## 2017-02-16 DIAGNOSIS — Z7984 Long term (current) use of oral hypoglycemic drugs: Secondary | ICD-10-CM | POA: Diagnosis not present

## 2017-02-16 DIAGNOSIS — F17210 Nicotine dependence, cigarettes, uncomplicated: Secondary | ICD-10-CM | POA: Diagnosis not present

## 2017-02-16 DIAGNOSIS — M545 Low back pain, unspecified: Secondary | ICD-10-CM

## 2017-02-16 DIAGNOSIS — W19XXXA Unspecified fall, initial encounter: Secondary | ICD-10-CM | POA: Insufficient documentation

## 2017-02-16 DIAGNOSIS — R0602 Shortness of breath: Secondary | ICD-10-CM | POA: Insufficient documentation

## 2017-02-16 LAB — I-STAT TROPONIN, ED
TROPONIN I, POC: 0 ng/mL (ref 0.00–0.08)
Troponin i, poc: 0 ng/mL (ref 0.00–0.08)

## 2017-02-16 LAB — CBC WITH DIFFERENTIAL/PLATELET
Basophils Absolute: 0 10*3/uL (ref 0.0–0.1)
Basophils Relative: 0 %
EOS PCT: 2 %
Eosinophils Absolute: 0.1 10*3/uL (ref 0.0–0.7)
HCT: 36.3 % — ABNORMAL LOW (ref 39.0–52.0)
Hemoglobin: 12.2 g/dL — ABNORMAL LOW (ref 13.0–17.0)
LYMPHS ABS: 1.2 10*3/uL (ref 0.7–4.0)
LYMPHS PCT: 23 %
MCH: 29.7 pg (ref 26.0–34.0)
MCHC: 33.6 g/dL (ref 30.0–36.0)
MCV: 88.3 fL (ref 78.0–100.0)
MONOS PCT: 10 %
Monocytes Absolute: 0.6 10*3/uL (ref 0.1–1.0)
Neutro Abs: 3.5 10*3/uL (ref 1.7–7.7)
Neutrophils Relative %: 65 %
PLATELETS: 317 10*3/uL (ref 150–400)
RBC: 4.11 MIL/uL — ABNORMAL LOW (ref 4.22–5.81)
RDW: 14.9 % (ref 11.5–15.5)
WBC: 5.3 10*3/uL (ref 4.0–10.5)

## 2017-02-16 LAB — COMPREHENSIVE METABOLIC PANEL
ALT: 16 U/L — ABNORMAL LOW (ref 17–63)
ANION GAP: 9 (ref 5–15)
AST: 23 U/L (ref 15–41)
Albumin: 3.4 g/dL — ABNORMAL LOW (ref 3.5–5.0)
Alkaline Phosphatase: 49 U/L (ref 38–126)
BUN: 10 mg/dL (ref 6–20)
CHLORIDE: 105 mmol/L (ref 101–111)
CO2: 23 mmol/L (ref 22–32)
Calcium: 8.9 mg/dL (ref 8.9–10.3)
Creatinine, Ser: 1.26 mg/dL — ABNORMAL HIGH (ref 0.61–1.24)
Glucose, Bld: 289 mg/dL — ABNORMAL HIGH (ref 65–99)
Potassium: 4.6 mmol/L (ref 3.5–5.1)
SODIUM: 137 mmol/L (ref 135–145)
Total Bilirubin: 0.8 mg/dL (ref 0.3–1.2)
Total Protein: 6.2 g/dL — ABNORMAL LOW (ref 6.5–8.1)

## 2017-02-16 LAB — URINALYSIS, ROUTINE W REFLEX MICROSCOPIC
Bilirubin Urine: NEGATIVE
Hgb urine dipstick: NEGATIVE
KETONES UR: NEGATIVE mg/dL
LEUKOCYTES UA: NEGATIVE
Nitrite: NEGATIVE
PH: 6 (ref 5.0–8.0)
Protein, ur: NEGATIVE mg/dL
RBC / HPF: NONE SEEN RBC/hpf (ref 0–5)
SPECIFIC GRAVITY, URINE: 1.024 (ref 1.005–1.030)

## 2017-02-16 LAB — BRAIN NATRIURETIC PEPTIDE: B NATRIURETIC PEPTIDE 5: 10.2 pg/mL (ref 0.0–100.0)

## 2017-02-16 LAB — D-DIMER, QUANTITATIVE (NOT AT ARMC): D DIMER QUANT: 0.54 ug{FEU}/mL — AB (ref 0.00–0.50)

## 2017-02-16 MED ORDER — GADOBENATE DIMEGLUMINE 529 MG/ML IV SOLN
12.0000 mL | Freq: Once | INTRAVENOUS | Status: AC | PRN
  Administered 2017-02-16: 12 mL via INTRAVENOUS

## 2017-02-16 MED ORDER — SODIUM CHLORIDE 0.9 % IV BOLUS (SEPSIS)
1000.0000 mL | Freq: Once | INTRAVENOUS | Status: AC
  Administered 2017-02-16: 1000 mL via INTRAVENOUS

## 2017-02-16 MED ORDER — OXYCODONE HCL 5 MG PO TABS
5.0000 mg | ORAL_TABLET | Freq: Once | ORAL | Status: AC
  Administered 2017-02-16: 5 mg via ORAL
  Filled 2017-02-16: qty 1

## 2017-02-16 MED ORDER — IOPAMIDOL (ISOVUE-370) INJECTION 76%
INTRAVENOUS | Status: AC
  Administered 2017-02-16: 100 mL via INTRAVENOUS
  Filled 2017-02-16: qty 100

## 2017-02-16 MED ORDER — SODIUM CHLORIDE 0.9 % IV BOLUS (SEPSIS)
1000.0000 mL | Freq: Once | INTRAVENOUS | Status: AC
Start: 2017-02-16 — End: 2017-02-16
  Administered 2017-02-16: 1000 mL via INTRAVENOUS

## 2017-02-16 MED ORDER — ACETAMINOPHEN 500 MG PO TABS
1000.0000 mg | ORAL_TABLET | Freq: Once | ORAL | Status: AC
  Administered 2017-02-16: 1000 mg via ORAL
  Filled 2017-02-16: qty 2

## 2017-02-16 NOTE — ED Notes (Signed)
Patient had [place] sandwich and beverage in front of him when this RN rounded.  Provided with crackers and peanut butter, per pt request.

## 2017-02-16 NOTE — ED Provider Notes (Signed)
[facility]Y DEPT Provider Note   CSN: [id number] Arrival date & time: 02/16/17  4098     History   Chief Complaint Chief Complaint  Patient presents with  . Loss of Consciousness    HPI Allen Hernandez is a 56 y.o. male.  HPI   56yo male with history of DM, scoliosis, chronic back pain, prior intubation for pneumonia, presents with concern for syncope.  Was in normal state of health yesterday. This morning, woke up, had some cereal, went outside to smoke a cigarette and then began to feel very lightheadeded, diaphoretic and that's all he remembers. Didn't have anything else to drink this AM. Reports fell into the door, not sure if hit head.  Not sure how long was out for, maybe a minute or two. No seizure like activity.  Picked up and put on picnic table then felt lightheaded still. Feeling fatigued now.  Chronic back pain now worse.  Was here last week with back pain. Back pain hasn't gotten better.  Was going to come back today for evaluation for back.  Did not start any new medications.  Don't have any home back pain medications now, no gabapentin, flexeril, oxycodone or robaxin. Did not start any new medications today.   After waking from syncope noted some central chest pain, stinging, mild dyspnea.    Has had vomiting over the last month, 1-2 times per night. Eats during the day but vomits at night. Vomiting after lays down to sleep.  Feels like not keeping enough down.  Lost a lot of weight while in the hospital in November and trying to get weight back so wonder if overeating.   No diarrhea, no black or bloody stool.  Drink etoh sometimes, not often.  Occ marijuana, no other known drugs.   No numbness, weakness, trouble talking. Feels unsteady/lightheaded with movement.  Past Medical History:  Diagnosis Date  . Diabetes (HCC)    recently diagnosed - 3 months ago  . Neuropathy due to secondary diabetes (HCC) 08/2014  . Pneumonia   . Renal disorder   . Respiratory failure  [facility])     Patient Active Problem List   Diagnosis Date Noted  . Chronic myofascial pain 02/19/2015  . 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 Type 2 diabetes mellitus without complication (HCC) 07/13/2014  . Scoliosis (and kyphoscoliosis), idiopathic 11/04/2013  . Low back pain 11/04/2013    Past Surgical History:  Procedure Laterality Date  . SPINE SURGERY         Home Medications    Prior to Admission medications   Medication Sig Start Date End Date Taking? Authorizing Provider  gabapentin (NEURONTIN) 300 MG capsule Take 1 capsule (300 mg total) by mouth 3 (three) times daily. Patient taking differently: Take 300 mg by mouth at bedtime.  01/04/15  Yes Jegede, Olugbemiga E, MD  glipiZIDE (GLUCOTROL) 5 MG tablet Take 2.5 mg by mouth daily. 09/07/16  Yes [provider]  metFORMIN (GLUCOPHAGE) 500 MG tablet Take 1 tablet (500 mg total) by mouth 2 (two) times daily with a meal. 01/04/15  Yes Jegede, Olugbemiga E, MD  omeprazole (PRILOSEC) 20 MG capsule Take 20 mg by mouth daily.   Yes [provider]  cyclobenzaprine (FLEXERIL) 10 MG tablet Take 1 tablet (10 mg total) by mouth 2 (two) times daily as needed for muscle spasms. Patient not taking: Reported on 02/16/2017 02/07/17   Janne Napoleon, NP  dapagliflozin propanediol (FARXIGA) 10 MG TABS tablet Take 10 mg by mouth daily.  Patient not taking: Reported on 09/23/2016 01/04/15   Quentin Angst, MD  ibuprofen (ADVIL,MOTRIN) 400 MG tablet Take 1 tablet (400 mg total) by mouth every 6 (six) hours as needed for fever. Patient not taking: Reported on 02/16/2017 11/04/13   Quentin Angst, MD  methocarbamol (ROBAXIN) 500 MG tablet Take 1 tablet (500 mg total) by mouth at bedtime and may repeat dose one time if needed. Patient not taking: Reported on 02/16/2017 09/23/16   Bethel Born, PA-C    Family History Family History  Problem Relation Age of Onset  . Cancer Mother   . Cancer Father   . Diabetes Brother      Social History Social History  Substance Use Topics  . Smoking status: Current Every Day Smoker    Packs/day: 0.25    Types: Cigarettes  . Smokeless tobacco: Never Used  . Alcohol use 0.0 oz/week     Comment: occasionally     Allergies   Patient has no known allergies.   Review of Systems Review of Systems  Constitutional: Negative for fever.  HENT: Negative for congestion, ear pain, sore throat, tinnitus and trouble swallowing.   Eyes: Negative for visual disturbance.  Respiratory: Positive for shortness of breath. Negative for cough.   Cardiovascular: Positive for chest pain.  Gastrointestinal: Positive for nausea and vomiting. Negative for abdominal pain, constipation and diarrhea.  Genitourinary: Negative for difficulty urinating.  Musculoskeletal: Positive for back pain. Negative for neck stiffness.  Skin: Negative for rash.  Neurological: Positive for dizziness, syncope, light-headedness and headaches. Negative for facial asymmetry, speech difficulty, weakness and numbness.     Physical Exam Updated Vital Signs BP 128/84   Pulse (!) 58   Temp 98.7 F (37.1 C) (Oral)   Resp 14   SpO2 98%   Physical Exam  Constitutional: He is oriented to person, place, and time. He appears well-developed and well-nourished. No distress.  HENT:  Head: Normocephalic and atraumatic.  Eyes: Conjunctivae and EOM are normal. Pupils are equal, round, and reactive to light.  Neck: Normal range of motion.  Cardiovascular: Normal rate, regular rhythm, normal heart sounds and intact distal pulses.  Exam reveals no gallop and no friction rub.   No murmur heard. Pulmonary/Chest: Effort normal and breath sounds normal. No respiratory distress. He has no wheezes. He has no rales.  Abdominal: Soft. He exhibits no distension. There is no tenderness. There is no guarding.  Musculoskeletal: He exhibits no edema.       Lumbar back: He exhibits bony tenderness.  Neurological: He is alert and  oriented to person, place, and time. He has normal strength. No cranial nerve deficit or sensory deficit. Gait abnormal. Coordination normal. GCS eye subscore is 4. GCS verbal subscore is 5. GCS motor subscore is 6.  Positve romberg   Skin: Skin is warm and dry. He is not diaphoretic.  Nursing note and vitals reviewed.    ED Treatments / Results  Labs (all labs ordered are listed, but only abnormal results are displayed) Labs Reviewed  CBC WITH DIFFERENTIAL/PLATELET - Abnormal; Notable for the following:       Result Value   RBC 4.11 (*)    Hemoglobin 12.2 (*)    HCT 36.3 (*)    All other components within normal limits  COMPREHENSIVE METABOLIC PANEL - Abnormal; Notable for the following:    Glucose, Bld 289 (*)    Creatinine, Ser 1.26 (*)    Total Protein 6.2 (*)    Albumin  3.4 (*)    ALT 16 (*)    All other components within normal limits  D-DIMER, QUANTITATIVE (NOT AT [facility]) - Abnormal; Notable for the following:    D-Dimer, Quant 0.54 (*)    All other components within normal limits  URINALYSIS, ROUTINE W REFLEX MICROSCOPIC - Abnormal; Notable for the following:    Glucose, UA >=500 (*)    Bacteria, UA RARE (*)    Squamous Epithelial / LPF 0-5 (*)    All other components within normal limits  BRAIN NATRIURETIC PEPTIDE  RAPID URINE DRUG SCREEN, HOSP PERFORMED  I-STAT TROPOININ, ED  I-STAT TROPOININ, ED    EKG  EKG Interpretation  Date/Time:  Friday Feb 16 2017 10:01:30 EDT Ventricular Rate:  67 PR Interval:    QRS Duration: 86 QT Interval:  432 QTC Calculation: 457 R Axis:   77 Text Interpretation:  Sinus rhythm Probable left atrial enlargement Abnrm T, consider ischemia, anterolateral lds ST elev, probable normal early repol pattern No significant change since last tracing Confirmed by [facility] MD, Gurinder Toral ([id number]) on 02/16/2017 10:52:01 AM       Radiology Dg Chest 2 View  Result Date: 02/16/2017 CLINICAL DATA:  Syncope today.  Dyspnea EXAM: CHEST  2 VIEW  COMPARISON:  Chest CT from the same day FINDINGS: Normal heart size and negative mediastinal contours when accounting for distortion by scoliosis. There is no edema, consolidation, effusion, or pneumothorax. IMPRESSION: No evidence of active disease. Electronically Signed   By: Marnee Spring M.D.   On: 02/16/2017 14:05   Dg Lumbar Spine Complete  Result Date: 02/16/2017 CLINICAL DATA:  Syncope.  Fall. EXAM: LUMBAR SPINE - COMPLETE 4+ VIEW COMPARISON:  02/07/2017 FINDINGS: A metal Harrington rod extends from the thoracic spine to the lumbar spine at the level of the L2 lamina. There is stable dextroscoliosis of the lower thoracic spine. An additional Harrington rod extends from the upper thorax to the lower thoracic spine. No vertebral compression deformity. There is vacuum discs noted at the L4-5 level. No evidence of breakage of the hardware. IMPRESSION: Stable scoliosis and postoperative changes. Electronically Signed   By: Jolaine Click M.D.   On: 02/16/2017 14:10   Ct Head Wo Contrast  Result Date: 02/16/2017 CLINICAL DATA:  Syncope today EXAM: CT HEAD WITHOUT CONTRAST TECHNIQUE: Contiguous axial images were obtained from the base of the skull through the vertex without intravenous contrast. COMPARISON:  06/17/2016 FINDINGS: Brain: Mild chronic microvascular disease throughout the deep white matter. No acute intracranial abnormality. Specifically, no hemorrhage, hydrocephalus, mass lesion, acute infarction, or significant intracranial injury. Vascular: No hyperdense vessel or unexpected calcification. Skull: No acute calvarial abnormality. Sinuses/Orbits: Visualized paranasal sinuses and mastoids clear. Orbital soft tissues unremarkable. Other: None IMPRESSION: Mild chronic small vessel disease throughout the deep white matter. No acute intracranial abnormality. Electronically Signed   By: Charlett Nose M.D.   On: 02/16/2017 11:24   Ct Angio Chest Pe W Or Wo Contrast  Result Date: 02/16/2017 CLINICAL  DATA:  56 year old male with history of generalized chest pain after syncopal episode at home earlier today. EXAM: CT ANGIOGRAPHY CHEST WITH CONTRAST TECHNIQUE: Multidetector CT imaging of the chest was performed using the standard protocol during bolus administration of intravenous contrast. Multiplanar CT image reconstructions and MIPs were obtained to evaluate the vascular anatomy. CONTRAST:  80 mL of Isovue 370. COMPARISON:  No priors. FINDINGS: Cardiovascular: No filling defects within the pulmonary arterial tree to suggest underlying pulmonary embolism. Heart size is normal. There is no  significant pericardial fluid, thickening or pericardial calcification. No atherosclerotic calcifications in the thoracic aorta. No definite coronary artery calcifications are identified. Mediastinum/Nodes: No pathologically enlarged mediastinal or hilar lymph nodes. Esophagus is unremarkable in appearance. No axillary lymphadenopathy. Lungs/Pleura: Mild diffuse bronchial wall thickening with mild centrilobular and paraseptal emphysema. Some bullous changes are noted in the right upper lobe, most notably adjacent to the superior vena cava. No acute consolidative airspace disease. No pleural effusions. No definite suspicious appearing pulmonary nodules or masses are noted. Upper Abdomen: Unremarkable. Musculoskeletal: Orthopedic fixation hardware in the thoracolumbar spine incompletely visualized. Severe dextroscoliosis of the thoracolumbar spine. There are no aggressive appearing lytic or blastic lesions noted in the visualized portions of the skeleton. Review of the MIP images confirms the above findings. IMPRESSION: 1. No evidence of pulmonary embolism. 2. No acute findings are noted in the thorax to account for the patient's symptoms. 3. Diffuse bronchial wall thickening with mild centrilobular and paraseptal emphysema; imaging findings suggestive of underlying COPD. 4. Additional incidental findings, as above.  Electronically Signed   By: Trudie Reedaniel  Entrikin M.D.   On: 02/16/2017 13:31    Procedures Procedures (including critical care time)  Medications Ordered in ED Medications  sodium chloride 0.9 % bolus 1,000 mL (0 mLs Intravenous Stopped 02/16/17 1331)  acetaminophen (TYLENOL) tablet 1,000 mg (1,000 mg Oral Given 02/16/17 1055)  oxyCODONE (Oxy IR/ROXICODONE) immediate release tablet 5 mg (5 mg Oral Given 02/16/17 1055)  sodium chloride 0.9 % bolus 1,000 mL (1,000 mLs Intravenous New Bag/Given 02/16/17 1320)  iopamidol (ISOVUE-370) 76 % injection (100 mLs Intravenous Contrast Given 02/16/17 1301)     Initial Impression / Assessment and Plan / ED Course  I have reviewed the triage vital signs and the nursing notes.  Pertinent labs & imaging results that were available during my care of the patient were reviewed by me and considered in my medical decision making (see chart for details).     56yo male with history of DM, scoliosis, chronic back pain, prior intubation for pneumonia, presents with concern for syncope.  EKG evaluated by me and shows no septal no delta waves, no sign of prolonged QTc, no Brugada, no HOCM, no acute ST changes.  No significant anemia or history to suggest GI bleed. No significant electrolyte abnormality. Troponin and BNP negative. D-dimer positive. CT PE study negative for PE.  Second troponin checked and negative.  Have low suspicion for ACS in the setting of negative troponins, feel syncope is more likely secondary to dehydration by history. Patient with orthostatic vital sign changes and symptoms with EMS, reports not drinking fluids this AM, emesis last night, had pre-syncopal symptoms prior to syncope.  No sign of arrhythmia on telemetry.   In terms of back pain, patient with acute on chronic back pain after possible fall during syncope. X-ray was done which showed no acute abnormalities. No other red flags of back pain present. Given Tylenol 1000 mg, and one dose of  oxycodone during evaluation.    Patient also reports hitting his head with syncopal episode, and also reports nighttime emesis over the last month, and given comminution of symptoms, ordered screening head CT which showed no acute abnormalities.  Patient reevaluated after receiving 2 L of normal saline. He reports some improvement while he is at rest, however continuing slight sensation of lightheadedness or dizziness which is significantly worse upon standing. Orthostatic vital signs were checked which did not show significant blood pressure changes, however patient was severely symptomatic, reporting feeling unsteady  and dizzy.  He does not have classic symptoms of peripheral vertigo, however.  Patient laying for long time, possible contributor. Given food, beverage and improved however still with unsteady gait, feeling of dizziness/lightheadedness.  Patient observed for many hours in the ED without signs of arrhythmia, no cardiac hx, do not feel he requires admission from standpoint of syncope/arrhythmia eval.   Given patient still with unsteady gait, dizziness, syncopal episode, CVA is on differential. Will evaluate for posterior circulation involvement with MR brain and MRA head and neck.  If these tests are negative, patient may be discharged with PCP follow-up for back pain, dizziness. If MRI is negative, consider concussion as possible etiology of his continuing dizziness after hitting his head with syncope secondary to dehydration.   Final Clinical Impressions(s) / ED Diagnoses   Final diagnoses:  Chronic midline low back pain without sciatica  Syncope, unspecified syncope type  Syncope  LOC (loss of consciousness) (HCC)  Traumatic injury of head, initial encounter    New Prescriptions New Prescriptions   No medications on file     Alvira Monday, MD 02/16/17 1744

## 2017-02-16 NOTE — ED Notes (Signed)
Patient able to ambulate independently  

## 2017-02-16 NOTE — ED Notes (Signed)
Pt stated he urinated before he got here and does not feel like he can provide a sample right now. Pt given urinal to provide sample when able.

## 2017-02-16 NOTE — ED Notes (Signed)
MD at bedside updating patient.

## 2017-02-16 NOTE — ED Notes (Signed)
Patient transported to MRI 

## 2017-02-16 NOTE — ED Triage Notes (Signed)
Patient present today with complaints of Syncope. Patient reports he was up taking and he woke up with people around him.EMS patient was pale and  diaphoric on arrival and BP 93 HR 13 > EMS reports when they move patient to stretcher patient has near syncope. Patient alert and oriented x4 on arrival. Patient reports dizzness on arrival. Patient complains of Chest Pain after syncope. Patient reports weakness.

## 2017-02-16 NOTE — ED Notes (Signed)
Pt was symptomatic when he sat up. Stayed dizzy throughout and was swaying while sitting. HR increased and did not perform the standing vitals based on this.

## 2017-02-17 NOTE — ED Provider Notes (Signed)
I received this patient in signout from Dr. Dalene SeltzerSchlossman. We were awaiting MR imaging. MRI brain w/ MRA showed patent vessels with no acute findings such as stroke or mass. Pt well appearing and comfortable on re-examination. His CTA was negative for PE and otherwise reassuring work up. Discussed f/u with PCP and reviewed return precautions.   Kura Bethards, Ambrose Finlandachel Morgan, MD 02/17/17 [id number]

## 2017-03-21 ENCOUNTER — Encounter (HOSPITAL_COMMUNITY): Payer: Self-pay | Admitting: *Deleted

## 2017-03-21 ENCOUNTER — Emergency Department (HOSPITAL_COMMUNITY)
Admission: EM | Admit: 2017-03-21 | Discharge: 2017-03-21 | Disposition: A | Discharge status: Home / Self Care | Payer: Medicaid Other | Attending: Emergency Medicine | Admitting: Emergency Medicine

## 2017-03-21 DIAGNOSIS — L0201 Cutaneous abscess of face: Secondary | ICD-10-CM | POA: Diagnosis present

## 2017-03-21 DIAGNOSIS — F17210 Nicotine dependence, cigarettes, uncomplicated: Secondary | ICD-10-CM | POA: Diagnosis not present

## 2017-03-21 DIAGNOSIS — E119 Type 2 diabetes mellitus without complications: Secondary | ICD-10-CM | POA: Insufficient documentation

## 2017-03-21 DIAGNOSIS — Z7984 Long term (current) use of oral hypoglycemic drugs: Secondary | ICD-10-CM | POA: Insufficient documentation

## 2017-03-21 DIAGNOSIS — Z79899 Other long term (current) drug therapy: Secondary | ICD-10-CM | POA: Insufficient documentation

## 2017-03-21 DIAGNOSIS — L0202 Furuncle of face: Secondary | ICD-10-CM | POA: Insufficient documentation

## 2017-03-21 MED ORDER — ACETAMINOPHEN 325 MG PO TABS: 650.0000 mg | ORAL_TABLET | Freq: Four times a day (QID) | ORAL | 0 refills | Status: DC | PRN

## 2017-03-21 MED ORDER — DOXYCYCLINE HYCLATE 100 MG PO CAPS: 100.0000 mg | ORAL_CAPSULE | Freq: Two times a day (BID) | ORAL | 0 refills | Status: DC

## 2017-03-21 NOTE — ED Provider Notes (Signed)
[facility] DEPT Provider Note   CSN: [id number] Arrival date & time: 03/21/17  1114  By signing my name below, I, Allen Hernandez, attest that this documentation has been prepared under the direction and in the presence of Allen FarrierWilliam Adalene Gulotta, PA-C. Electronically Signed: Cynda AcresHailei Hernandez, Scribe. 03/21/17. 12:35 PM.  History   Chief Complaint Chief Complaint  Patient presents with  . Abscess   HPI Comments: Allen Hernandez is a 56 y.o. male with a history of diabetes, who presents to the Emergency Department complaining of an "abscess" to the left face that appeared several days ago. Patient reports developing an abscess several days ago that has been gradually increasing in size. Patient does shave with a razor. Patient reports associated pain, swelling, and drainage from the site.  No modifying factors indicated. Patient denies any dental pain, fever, chills, nausea, or vomiting.   The history is provided by the patient. No language interpreter was used.    Past Medical History:  Diagnosis Date  . Diabetes (HCC)    recently diagnosed - 3 months ago  . Neuropathy due to secondary diabetes (HCC) 08/2014  . Pneumonia   . Renal disorder   . Respiratory failure [facility](HCC)     Patient Active Problem List   Diagnosis Date Noted  . Chronic myofascial pain 02/19/2015  . 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 Type 2 diabetes mellitus without complication (HCC) 07/13/2014  . Scoliosis (and kyphoscoliosis), idiopathic 11/04/2013  . Low back pain 11/04/2013    Past Surgical History:  Procedure Laterality Date  . SPINE SURGERY         Home Medications    Prior to Admission medications   Medication Sig Start Date End Date Taking? Authorizing Provider  acetaminophen (TYLENOL) 325 MG tablet Take 2 tablets (650 mg total) by mouth every 6 (six) hours as needed for mild pain or moderate pain. 03/21/17   Allen Farrieransie, Allen Goeden, PA-C  cyclobenzaprine (FLEXERIL) 10 MG tablet Take 1 tablet (10 mg total) by  mouth 2 (two) times daily as needed for muscle spasms. Patient not taking: Reported on 02/16/2017 02/07/17   Allen Hernandez, Allen M, NP  dapagliflozin propanediol (FARXIGA) 10 MG TABS tablet Take 10 mg by mouth daily. Patient not taking: Reported on 09/23/2016 01/04/15   Allen Hernandez, Allen E, MD  doxycycline (VIBRAMYCIN) 100 MG capsule Take 1 capsule (100 mg total) by mouth 2 (two) times daily. 03/21/17   Allen Farrieransie, Allen Mathena, PA-C  gabapentin (NEURONTIN) 300 MG capsule Take 1 capsule (300 mg total) by mouth 3 (three) times daily. Patient taking differently: Take 300 mg by mouth at bedtime.  01/04/15   Allen Hernandez, Allen E, MD  glipiZIDE (GLUCOTROL) 5 MG tablet Take 2.5 mg by mouth daily. 09/07/16   [provider]  metFORMIN (GLUCOPHAGE) 500 MG tablet Take 1 tablet (500 mg total) by mouth 2 (two) times daily with a meal. 01/04/15   Jegede, Allen Blakeslugbemiga E, MD  methocarbamol (ROBAXIN) 500 MG tablet Take 1 tablet (500 mg total) by mouth at bedtime and may repeat dose one time if needed. Patient not taking: Reported on 02/16/2017 09/23/16   Allen Hernandez, Allen Marie, PA-C  omeprazole (PRILOSEC) 20 MG capsule Take 20 mg by mouth daily.    [provider]    Family History Family History  Problem Relation Age of Onset  . Cancer Mother   . Cancer Father   . Diabetes Brother     Social History Social History  Substance Use Topics  . Smoking status: Current Every Day Smoker  Packs/day: 0.25    Types: Cigarettes  . Smokeless tobacco: Never Used  . Alcohol use 0.0 oz/week     Comment: occasionally     Allergies   Patient has no known allergies.   Review of Systems Review of Systems  Constitutional: Negative for chills and fever.  HENT: Negative for dental problem.   Gastrointestinal: Negative for nausea and vomiting.  Skin: Positive for rash.       Abscess to the left cheek     Physical Exam Updated Vital Signs BP 133/87 (BP Location: Left Arm)   Pulse 87   Temp 98.6 F (37 C) (Oral)    Resp 18   SpO2 98%   Physical Exam  Constitutional: He is oriented to person, place, and time. He appears well-developed and well-nourished. No distress.  Nontoxic appearing.  HENT:  Head: Normocephalic and atraumatic.  Right Ear: External ear normal.  Left Ear: External ear normal.  Mouth/Throat: Oropharynx is clear and moist. No oropharyngeal exudate.  2 cm abscess to left face just lateral to the lips and beard. Localized just to face. No active drainage. No overlying or surrounding erythema.  Mouth and throat are clear. No dental abscesses or pain.   Eyes: Pupils are equal, round, and reactive to light. Right eye exhibits no discharge. Left eye exhibits no discharge.  Cardiovascular: Normal rate and intact distal pulses.   Pulmonary/Chest: Effort normal. No respiratory distress.  Neurological: He is alert and oriented to person, place, and time. No cranial nerve deficit. Coordination normal.  Skin: Skin is warm and dry. Capillary refill takes less than 2 seconds. No rash noted. He is not diaphoretic. No erythema. No pallor.  Psychiatric: He has a normal mood and affect. His behavior is normal.  Nursing note and vitals reviewed.    ED Treatments / Results  DIAGNOSTIC STUDIES: Oxygen Saturation is 98% on RA, normal by my interpretation.    COORDINATION OF CARE: 12:34 PM Discussed treatment plan with pt at bedside and pt agreed to plan, which includes a course of anabiotics.   Labs (all labs ordered are listed, but only abnormal results are displayed) Labs Reviewed - No data to display  EKG  EKG Interpretation None       Radiology No results found.  Procedures Procedures (including critical care time)  Medications Ordered in ED Medications - No data to display   Initial Impression / Assessment and Plan / ED Course  I have reviewed the triage vital signs and the nursing notes.  Pertinent labs & imaging results that were available during my care of the patient  were reviewed by me and considered in my medical decision making (see chart for details).    Patient presents with abscess developing gradually to his face. On exam he is afebrile nontoxic appearing. He has evidence of a furuncle to his left face within his beard. No surrounding or overlying erythema. Will start on doxycycline. Strict and specific return precautions discussed. I advised the patient to follow-up with their primary care provider this week. I advised the patient to return to the emergency department with new or worsening symptoms or new concerns. The patient verbalized understanding and agreement with plan.      Final Clinical Impressions(s) / ED Diagnoses   Final diagnoses:  Furuncle of cheek    New Prescriptions New Prescriptions   ACETAMINOPHEN (TYLENOL) 325 MG TABLET    Take 2 tablets (650 mg total) by mouth every 6 (six) hours as needed for mild  pain or moderate pain.   DOXYCYCLINE (VIBRAMYCIN) 100 MG CAPSULE    Take 1 capsule (100 mg total) by mouth 2 (two) times daily.   I personally performed the services described in this documentation, which was scribed in my presence. The recorded information has been reviewed and is accurate.      Allen Farrier, PA-C 03/21/17 1244    Benjiman Core, MD 03/21/17 1630

## 2017-03-21 NOTE — ED Triage Notes (Signed)
Pt reports having bump/abscess on left side of face for several days. Denies fever. No redness noted. Airway intact.

## 2017-04-04 ENCOUNTER — Emergency Department (HOSPITAL_COMMUNITY): Payer: Medicaid Other

## 2017-04-04 ENCOUNTER — Encounter (HOSPITAL_COMMUNITY): Payer: Self-pay | Admitting: *Deleted

## 2017-04-04 ENCOUNTER — Emergency Department (HOSPITAL_COMMUNITY)
Admission: EM | Admit: 2017-04-04 | Discharge: 2017-04-04 | Disposition: A | Discharge status: Home / Self Care | Payer: Medicaid Other | Attending: Emergency Medicine | Admitting: Emergency Medicine

## 2017-04-04 DIAGNOSIS — K920 Hematemesis: Secondary | ICD-10-CM

## 2017-04-04 DIAGNOSIS — Z7984 Long term (current) use of oral hypoglycemic drugs: Secondary | ICD-10-CM | POA: Diagnosis not present

## 2017-04-04 DIAGNOSIS — F17210 Nicotine dependence, cigarettes, uncomplicated: Secondary | ICD-10-CM | POA: Insufficient documentation

## 2017-04-04 DIAGNOSIS — R1013 Epigastric pain: Secondary | ICD-10-CM | POA: Insufficient documentation

## 2017-04-04 DIAGNOSIS — E119 Type 2 diabetes mellitus without complications: Secondary | ICD-10-CM | POA: Insufficient documentation

## 2017-04-04 LAB — COMPREHENSIVE METABOLIC PANEL
ALBUMIN: 4.2 g/dL (ref 3.5–5.0)
ALT: 22 U/L (ref 17–63)
ANION GAP: 11 (ref 5–15)
AST: 20 U/L (ref 15–41)
Alkaline Phosphatase: 71 U/L (ref 38–126)
BILIRUBIN TOTAL: 0.5 mg/dL (ref 0.3–1.2)
BUN: 14 mg/dL (ref 6–20)
CHLORIDE: 102 mmol/L (ref 101–111)
CO2: 24 mmol/L (ref 22–32)
Calcium: 9.2 mg/dL (ref 8.9–10.3)
Creatinine, Ser: 0.79 mg/dL (ref 0.61–1.24)
GFR calc Af Amer: >60 mL/min (ref >60)
GLUCOSE: 145 mg/dL — AB (ref 65–99)
POTASSIUM: 4.1 mmol/L (ref 3.5–5.1)
Sodium: 137 mmol/L (ref 135–145)
TOTAL PROTEIN: 7.5 g/dL (ref 6.5–8.1)

## 2017-04-04 LAB — CBC
HEMATOCRIT: 39.6 % (ref 39.0–52.0)
HEMOGLOBIN: 13.5 g/dL (ref 13.0–17.0)
MCH: 28.6 pg (ref 26.0–34.0)
MCHC: 34.1 g/dL (ref 30.0–36.0)
MCV: 83.9 fL (ref 78.0–100.0)
Platelets: 284 10*3/uL (ref 150–400)
RBC: 4.72 MIL/uL (ref 4.22–5.81)
RDW: 12.8 % (ref 11.5–15.5)
WBC: 9.1 10*3/uL (ref 4.0–10.5)

## 2017-04-04 LAB — TYPE AND SCREEN
ABO/RH(D): O POS
ANTIBODY SCREEN: NEGATIVE

## 2017-04-04 LAB — ABO/RH: ABO/RH(D): O POS

## 2017-04-04 MED ORDER — FAMOTIDINE 20 MG PO TABS: 20.0000 mg | ORAL_TABLET | Freq: Two times a day (BID) | ORAL | 0 refills | Status: DC

## 2017-04-04 MED ORDER — IOPAMIDOL (ISOVUE-300) INJECTION 61%
INTRAVENOUS | Status: AC
  Filled 2017-04-04: qty 100

## 2017-04-04 MED ORDER — SUCRALFATE 1 G PO TABS: 1.0000 g | ORAL_TABLET | Freq: Three times a day (TID) | ORAL | 0 refills | Status: DC

## 2017-04-04 MED ORDER — IOPAMIDOL (ISOVUE-300) INJECTION 61%
100.0000 mL | Freq: Once | INTRAVENOUS | Status: AC | PRN
  Administered 2017-04-04: 100 mL via INTRAVENOUS

## 2017-04-04 MED ORDER — ONDANSETRON 4 MG PO TBDP: 4.0000 mg | ORAL_TABLET | Freq: Three times a day (TID) | ORAL | 0 refills | Status: DC | PRN

## 2017-04-04 NOTE — ED Triage Notes (Signed)
Patient is alert and oriented x4.  He is being seen for nausea and vomiting after he states that he was drinking last night after celebrating for getting a new job.  Patient has a Hx of diabetes.  Currently rates his abdominal pain 4 of 10.

## 2017-04-04 NOTE — ED Notes (Signed)
Pt states he drank two 24 ounce beers last night. Vomited 3-4 times, with hematemesis with bright red blood, vomit was mostly blood. Feels lightheaded currently. No blood in stool or melena.

## 2017-04-04 NOTE — Discharge Instructions (Signed)
Please read and follow all provided instructions.  Your diagnoses today include:  1. Epigastric pain   2. Hematemesis with nausea     Tests performed today include:  Blood counts and electrolytes  Blood tests to check liver and kidney function  Blood tests to check pancreas function  Urine test to look for infection  CT scan - no severe problems in your stomach  Chest x-ray  Vital signs. See below for your results today.   Medications prescribed:   Pepcid (famotidine) - antihistamine  You can find this medication over-the-counter.   DO NOT exceed:   20mg  Pepcid every 12 hours   Carafate - for stomach upset and to protect your stomach   Zofran (ondansetron) - for nausea and vomiting  Take any prescribed medications only as directed.  Home care instructions:   Follow any educational materials contained in this packet.  Follow-up instructions: Please follow-up with your primary care provider in the next 3 days for further evaluation of your symptoms.    Return instructions:  SEEK IMMEDIATE MEDICAL ATTENTION IF:  The pain does not go away or becomes severe   A temperature above 101F develops   Repeated vomiting occurs (multiple episodes)   The pain becomes localized to portions of the abdomen. The right side could possibly be appendicitis. In an adult, the left lower portion of the abdomen could be colitis or diverticulitis.   Blood is being passed in stools or vomit (bright red or black tarry stools)   You develop chest pain, difficulty breathing, dizziness or fainting, or become confused, poorly responsive, or inconsolable (young children)  If you have any other emergent concerns regarding your health  Additional Information: Abdominal (belly) pain can be caused by many things. Your caregiver performed an examination and possibly ordered blood/urine tests and imaging (CT scan, x-rays, ultrasound). Many cases can be observed and treated at home after  initial evaluation in the emergency department. Even though you are being discharged home, abdominal pain can be unpredictable. Therefore, you need a repeated exam if your pain does not resolve, returns, or worsens. Most patients with abdominal pain don't have to be admitted to the hospital or have surgery, but serious problems like appendicitis and gallbladder attacks can start out as nonspecific pain. Many abdominal conditions cannot be diagnosed in one visit, so follow-up evaluations are very important.  Your vital signs today were: BP (!) 151/88    Pulse 90    Temp 97.9 F (36.6 C) (Oral)    Resp 16    SpO2 93%  If your blood pressure (bp) was elevated above 135/85 this visit, please have this repeated by your doctor within one month. --------------

## 2017-04-04 NOTE — ED Provider Notes (Signed)
[facility]-EMERGENCY DEPT Provider Note   CSN: [id number] Arrival date & time: 04/04/17  1256     History   Chief Complaint Chief Complaint  Patient presents with  . Dizziness  . GI Bleeding    HPI Allen Hernandez is a 56 y.o. male.  Patient presents with chief complaint hematemesis and abdominal pain. Patient has a history of ARDS and a 2 month hospital stay at [facility] last year because of this. Also diabetes and chronic pain. Patient states that he had 3 or 4 episodes of bloody vomit this morning. He does admit to drinking alcohol last night. He does not not drink alcohol every day but states that he vomits approximately 2 times per week. He does not have a known history of stomach ulcers does not know why he vomits. He does not usually have associated abdominal pain or bloody vomit. This is different. He feels little bit lightheaded at times. No shortness of breath or syncope. No changes in his stools. No blood in the urine or from the gums. Patient denies heavy NSAID use for any bleeding disorders.      Past Medical History:  Diagnosis Date  . Diabetes (HCC)    recently diagnosed - 3 months ago  . Neuropathy due to secondary diabetes (HCC) 08/2014  . Pneumonia   . Renal disorder   . Respiratory failure [facility])     Patient Active Problem List   Diagnosis Date Noted  . Chronic myofascial pain 02/19/2015  . 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 Type 2 diabetes mellitus without complication (HCC) 07/13/2014  . Scoliosis (and kyphoscoliosis), idiopathic 11/04/2013  . Low back pain 11/04/2013    Past Surgical History:  Procedure Laterality Date  . SPINE SURGERY         Home Medications    Prior to Admission medications   Medication Sig Start Date End Date Taking? Authorizing Provider  acetaminophen (TYLENOL) 325 MG tablet Take 2 tablets (650 mg total) by mouth every 6 (six) hours as needed for mild pain or moderate pain. 03/21/17   Everlene Farrier, PA-C  cyclobenzaprine  (FLEXERIL) 10 MG tablet Take 1 tablet (10 mg total) by mouth 2 (two) times daily as needed for muscle spasms. Patient not taking: Reported on 02/16/2017 02/07/17   Janne Napoleon, NP  dapagliflozin propanediol (FARXIGA) 10 MG TABS tablet Take 10 mg by mouth daily. Patient not taking: Reported on 09/23/2016 01/04/15   Quentin Angst, MD  doxycycline (VIBRAMYCIN) 100 MG capsule Take 1 capsule (100 mg total) by mouth 2 (two) times daily. 03/21/17   Everlene Farrier, PA-C  gabapentin (NEURONTIN) 300 MG capsule Take 1 capsule (300 mg total) by mouth 3 (three) times daily. Patient taking differently: Take 300 mg by mouth at bedtime.  01/04/15   Quentin Angst, MD  glipiZIDE (GLUCOTROL) 5 MG tablet Take 2.5 mg by mouth daily. 09/07/16   [provider]  metFORMIN (GLUCOPHAGE) 500 MG tablet Take 1 tablet (500 mg total) by mouth 2 (two) times daily with a meal. 01/04/15   Jegede, Phylliss Blakes, MD  methocarbamol (ROBAXIN) 500 MG tablet Take 1 tablet (500 mg total) by mouth at bedtime and may repeat dose one time if needed. Patient not taking: Reported on 02/16/2017 09/23/16   Bethel Born, PA-C  omeprazole (PRILOSEC) 20 MG capsule Take 20 mg by mouth daily.    [provider]    Family History Family History  Problem Relation Age of Onset  . Cancer Mother   .  Cancer Father   . Diabetes Brother     Social History Social History  Substance Use Topics  . Smoking status: Current Every Day Smoker    Packs/day: 0.25    Types: Cigarettes  . Smokeless tobacco: Never Used  . Alcohol use 0.0 oz/week     Comment: occasionally     Allergies   Patient has no known allergies.   Review of Systems Review of Systems  Constitutional: Negative for fever.  HENT: Negative for rhinorrhea and sore throat.   Eyes: Negative for redness.  Respiratory: Negative for cough.   Cardiovascular: Negative for chest pain.  Gastrointestinal: Positive for abdominal pain, nausea and vomiting.  Negative for blood in stool and diarrhea.  Genitourinary: Negative for dysuria.  Musculoskeletal: Negative for myalgias.  Skin: Negative for rash.  Neurological: Negative for headaches.     Physical Exam Updated Vital Signs BP 133/90   Pulse 82   Resp 16   SpO2 94%   Physical Exam  Constitutional: He appears well-developed and well-nourished.  HENT:  Head: Normocephalic and atraumatic.  Mouth/Throat: Oropharynx is clear and moist.  Eyes: Conjunctivae are normal. Right eye exhibits no discharge. Left eye exhibits no discharge.  Neck: Normal range of motion. Neck supple.  Cardiovascular: Normal rate, regular rhythm and normal heart sounds.   Pulmonary/Chest: Effort normal and breath sounds normal.  Abdominal: Soft. There is tenderness (Generalized, mild to moderate). There is no rebound and no guarding.  Neurological: He is alert.  Skin: Skin is warm and dry.  Psychiatric: He has a normal mood and affect.  Nursing note and vitals reviewed.    ED Treatments / Results  Labs (all labs ordered are listed, but only abnormal results are displayed) Labs Reviewed  COMPREHENSIVE METABOLIC PANEL - Abnormal; Notable for the following:       Result Value   Glucose, Bld 145 (*)    All other components within normal limits  CBC  POC OCCULT BLOOD, ED  TYPE AND SCREEN  ABO/RH    EKG  EKG Interpretation None       Radiology Dg Chest 2 View  Result Date: 04/04/2017 CLINICAL DATA:  Episode of vomiting last evening with hematemesis. Currently lightheaded. History of diabetes, current smoker. EXAM: CHEST  2 VIEW COMPARISON:  Chest x-ray of Feb 16, 2017 FINDINGS: The lungs are mildly hyperinflated. There is no focal infiltrate. There is no pleural effusion. The heart and pulmonary vascularity are normal. 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 This is stable. IMPRESSION: Chronic bronchitic-smoking related changes of  both lungs. No evidence of aspiration, CHF, pneumomediastinum, or mediastinal widening. Electronically Signed   By: David  [place]az[place] M.D.   On: 04/04/2017 16:47   Ct Abdomen Pelvis W Contrast  Result Date: 04/04/2017 CLINICAL DATA:  Recent alcohol ingestion now with hematemesis and bloody stools. EXAM: CT ABDOMEN AND PELVIS WITH CONTRAST TECHNIQUE: Multidetector CT imaging of the abdomen and pelvis was performed using the standard protocol following bolus administration of intravenous contrast. CONTRAST:  ISOVUE-300 IOPAMIDOL (ISOVUE-300) INJECTION 61% COMPARISON:  None. FINDINGS: Lower chest: Limited visualization of the lower thorax is negative for focal airspace opacity or pleural effusion. Normal heart size. No pericardial effusion. Hepatobiliary: Normal hepatic contour. No discrete hepatic lesions. Normal appearance of the gallbladder given degree distention. No radiopaque gallstones. No intra extrahepatic biliary duct dilatation. No ascites. The portal vein appears widely patent. Pancreas: Normal appearance of the pancreas. No peripancreatic stranding.  Spleen: Normal appearance of the spleen. Adrenals/Urinary Tract: There is symmetric enhancement of the bilateral kidneys. No definite renal stones on this postcontrast examination. No discrete renal lesions. No urine obstruction or perinephric stranding. Normal appearance the bilateral adrenal glands. There is mild thickening of the urinary bladder wall, presumably secondary to underdistention. Stomach/Bowel: Moderate colonic stool burden without evidence of enteric obstruction. Normal appearance of the terminal ileum and retrocecal appendix. No pneumoperitoneum, pneumatosis or portal venous gas. No definitive evidence of esophageal or gastric varices. No evidence of a hypertrophied splenorenal shunt. Vascular/Lymphatic: Normal caliber the abdominal aorta. The major branch vessels of the abdominal aorta appear patent on this non CTA examination. No bulky  retroperitoneal, mesenteric, pelvic or inguinal lymphadenopathy. Reproductive: Normal appearance of the prostate gland. Other: Regional soft tissues appear normal. Musculoskeletal: Post long segment spinal fusion with residual moderate severe rotatory scoliosis. Moderate severe multilevel lumbar spine DDD, worse at L4-L5 with disc space height loss, endplate irregularity and sclerosis IMPRESSION: No explanation for patient's hematemesis and bloody stools. Specifically, no evidence of cirrhosis, portal venous hypertension or esophageal/gastric varices. Electronically Signed   By: Simonne ComeJohn  Watts M.D.   On: 04/04/2017 19:02    Procedures Procedures (including critical care time)  Medications Ordered in ED Medications - No data to display   Initial Impression / Assessment and Plan / ED Course  I have reviewed the triage vital signs and the nursing notes.  Pertinent labs & imaging results that were available during my care of the patient were reviewed by me and considered in my medical decision making (see chart for details).     Patient seen and examined. Work-up initiated. Medications ordered.   Vital signs reviewed and are as follows: BP 133/90   Pulse 82   Resp 16   SpO2 94%   CT scan Is reassuring. Patient updated. Will discharge to home with Pepcid, Zofran, Carafate.  Encouraged return to the emergency department with worsening symptoms, syncope, chest pain, shortness of breath. Patient encouraged to hydrate well, rest.    Orthostatic VS for the past 24 hrs:  BP- Lying Pulse- Lying BP- Sitting Pulse- Sitting BP- Standing at 0 minutes Pulse- Standing at 0 minutes  04/04/17 1628 (!) 157/91 83 151/88 93 146/90 86    BP (!) 151/88   Pulse 90   Temp 97.9 F (36.6 C) (Oral)   Resp 16   SpO2 93%      Final Clinical Impressions(s) / ED Diagnoses   Final diagnoses:  Epigastric pain  Hematemesis with nausea   Patient with reported hematemesis earlier today after drinking  alcohol last night. No history of esophageal varices or stomach ulcers. Labs here are reassuring, imaging of the abdomen and chest are largely negative. Vitals are stable, no fever. No signs of severe dehydration, patient is tolerating PO's. Lungs are clear and no signs suggestive of PNA. Low concern for appendicitis, cholecystitis, pancreatitis, ruptured viscus, UTI, kidney stone, aortic dissection, aortic aneurysm or other emergent abdominal etiology. Supportive therapy indicated with return if symptoms worsen.     New Prescriptions New Prescriptions   FAMOTIDINE (PEPCID) 20 MG TABLET    Take 1 tablet (20 mg total) by mouth 2 (two) times daily.   ONDANSETRON (ZOFRAN ODT) 4 MG DISINTEGRATING TABLET    Take 1 tablet (4 mg total) by mouth every 8 (eight) hours as needed for nausea or vomiting.   SUCRALFATE (CARAFATE) 1 G TABLET    Take 1 tablet (1 g total) by mouth 4 (four)  times daily -  with meals and at bedtime.     Renne Crigler, PA-C 04/04/17 1942    Maia Plan, MD 04/05/17 1135

## 2017-04-04 NOTE — ED Notes (Signed)
Pt ambulatory and independent at discharge.  Verbalized understanding of discharge instructions 

## 2018-02-07 ENCOUNTER — Encounter (HOSPITAL_COMMUNITY): Payer: Self-pay

## 2018-02-07 ENCOUNTER — Observation Stay (HOSPITAL_COMMUNITY)
Admission: EM | Admit: 2018-02-07 | Discharge: 2018-02-08 | Disposition: A | Discharge status: Home / Self Care | Payer: 59 | Attending: Internal Medicine | Admitting: Internal Medicine

## 2018-02-07 ENCOUNTER — Emergency Department (HOSPITAL_COMMUNITY): Payer: 59

## 2018-02-07 DIAGNOSIS — Z79899 Other long term (current) drug therapy: Secondary | ICD-10-CM | POA: Insufficient documentation

## 2018-02-07 DIAGNOSIS — E1165 Type 2 diabetes mellitus with hyperglycemia: Secondary | ICD-10-CM | POA: Diagnosis not present

## 2018-02-07 DIAGNOSIS — R0789 Other chest pain: Secondary | ICD-10-CM | POA: Diagnosis present

## 2018-02-07 DIAGNOSIS — Z981 Arthrodesis status: Secondary | ICD-10-CM | POA: Diagnosis not present

## 2018-02-07 DIAGNOSIS — F17210 Nicotine dependence, cigarettes, uncomplicated: Secondary | ICD-10-CM

## 2018-02-07 DIAGNOSIS — M545 Low back pain, unspecified: Secondary | ICD-10-CM | POA: Diagnosis present

## 2018-02-07 DIAGNOSIS — Z794 Long term (current) use of insulin: Secondary | ICD-10-CM | POA: Diagnosis not present

## 2018-02-07 DIAGNOSIS — G8929 Other chronic pain: Secondary | ICD-10-CM

## 2018-02-07 DIAGNOSIS — K219 Gastro-esophageal reflux disease without esophagitis: Secondary | ICD-10-CM | POA: Diagnosis not present

## 2018-02-07 DIAGNOSIS — Z8739 Personal history of other diseases of the musculoskeletal system and connective tissue: Secondary | ICD-10-CM | POA: Diagnosis not present

## 2018-02-07 DIAGNOSIS — E1142 Type 2 diabetes mellitus with diabetic polyneuropathy: Secondary | ICD-10-CM

## 2018-02-07 DIAGNOSIS — M549 Dorsalgia, unspecified: Secondary | ICD-10-CM

## 2018-02-07 DIAGNOSIS — R079 Chest pain, unspecified: Secondary | ICD-10-CM | POA: Diagnosis present

## 2018-02-07 DIAGNOSIS — F1410 Cocaine abuse, uncomplicated: Secondary | ICD-10-CM | POA: Diagnosis not present

## 2018-02-07 DIAGNOSIS — E1140 Type 2 diabetes mellitus with diabetic neuropathy, unspecified: Secondary | ICD-10-CM | POA: Diagnosis not present

## 2018-02-07 DIAGNOSIS — E119 Type 2 diabetes mellitus without complications: Secondary | ICD-10-CM

## 2018-02-07 DIAGNOSIS — M419 Scoliosis, unspecified: Secondary | ICD-10-CM | POA: Insufficient documentation

## 2018-02-07 LAB — BASIC METABOLIC PANEL
Anion gap: 6 (ref 5–15)
BUN: 14 mg/dL (ref 6–20)
CHLORIDE: 108 mmol/L (ref 101–111)
CO2: 26 mmol/L (ref 22–32)
Calcium: 8.9 mg/dL (ref 8.9–10.3)
Creatinine, Ser: 1.2 mg/dL (ref 0.61–1.24)
GFR calc non Af Amer: >60 mL/min (ref >60)
Glucose, Bld: 131 mg/dL — ABNORMAL HIGH (ref 65–99)
POTASSIUM: 4.2 mmol/L (ref 3.5–5.1)
SODIUM: 140 mmol/L (ref 135–145)

## 2018-02-07 LAB — MAGNESIUM: MAGNESIUM: 1.7 mg/dL (ref 1.7–2.4)

## 2018-02-07 LAB — CBC
HCT: 38.5 % — ABNORMAL LOW (ref 39.0–52.0)
HEMATOCRIT: 37.9 % — AB (ref 39.0–52.0)
HEMOGLOBIN: 12.4 g/dL — AB (ref 13.0–17.0)
Hemoglobin: 12.7 g/dL — ABNORMAL LOW (ref 13.0–17.0)
MCH: 28.2 pg (ref 26.0–34.0)
MCH: 28.7 pg (ref 26.0–34.0)
MCHC: 32.7 g/dL (ref 30.0–36.0)
MCHC: 33 g/dL (ref 30.0–36.0)
MCV: 86.3 fL (ref 78.0–100.0)
MCV: 87.1 fL (ref 78.0–100.0)
PLATELETS: 306 10*3/uL (ref 150–400)
Platelets: 301 10*3/uL (ref 150–400)
RBC: 4.39 MIL/uL (ref 4.22–5.81)
RBC: 4.42 MIL/uL (ref 4.22–5.81)
RDW: 13.8 % (ref 11.5–15.5)
RDW: 13.9 % (ref 11.5–15.5)
WBC: 7.2 10*3/uL (ref 4.0–10.5)
WBC: 6.5 10*3/uL (ref 4.0–10.5)

## 2018-02-07 LAB — RAPID URINE DRUG SCREEN, HOSP PERFORMED
Amphetamines: NOT DETECTED
BARBITURATES: NOT DETECTED
Benzodiazepines: NOT DETECTED
Cocaine: POSITIVE — AB
Opiates: NOT DETECTED
TETRAHYDROCANNABINOL: NOT DETECTED

## 2018-02-07 LAB — HEMOGLOBIN A1C
Hgb A1c MFr Bld: 8.2 % — ABNORMAL HIGH (ref 4.8–5.6)
Mean Plasma Glucose: 188.64 mg/dL

## 2018-02-07 LAB — I-STAT TROPONIN, ED: TROPONIN I, POC: 0.01 ng/mL (ref 0.00–0.08)

## 2018-02-07 LAB — GLUCOSE, CAPILLARY
GLUCOSE-CAPILLARY: 188 mg/dL — AB (ref 65–99)
Glucose-Capillary: 288 mg/dL — ABNORMAL HIGH (ref 65–99)

## 2018-02-07 LAB — CREATININE, SERUM
CREATININE: 1.15 mg/dL (ref 0.61–1.24)
GFR calc Af Amer: >60 mL/min (ref >60)
GFR calc non Af Amer: >60 mL/min (ref >60)

## 2018-02-07 LAB — TROPONIN I

## 2018-02-07 MED ORDER — ACETAMINOPHEN 650 MG RE SUPP: 650.0000 mg | Freq: Four times a day (QID) | RECTAL | Status: DC | PRN

## 2018-02-07 MED ORDER — INSULIN ASPART 100 UNIT/ML ~~LOC~~ SOLN
0.0000 [IU] | Freq: Three times a day (TID) | SUBCUTANEOUS | Status: DC
  Administered 2018-02-08 (×2): 3 [IU] via SUBCUTANEOUS

## 2018-02-07 MED ORDER — ONDANSETRON HCL 4 MG PO TABS: 4.0000 mg | ORAL_TABLET | Freq: Four times a day (QID) | ORAL | Status: DC | PRN

## 2018-02-07 MED ORDER — POLYETHYLENE GLYCOL 3350 17 G PO PACK: 17.0000 g | PACK | Freq: Every day | ORAL | Status: DC | PRN

## 2018-02-07 MED ORDER — ASPIRIN EC 81 MG PO TBEC
81.0000 mg | DELAYED_RELEASE_TABLET | Freq: Every day | ORAL | Status: DC
  Administered 2018-02-07 – 2018-02-08 (×2): 81 mg via ORAL
  Filled 2018-02-07 (×2): qty 1

## 2018-02-07 MED ORDER — ONDANSETRON HCL 4 MG/2ML IJ SOLN: 4.0000 mg | Freq: Four times a day (QID) | INTRAMUSCULAR | Status: DC | PRN

## 2018-02-07 MED ORDER — ACETAMINOPHEN 325 MG PO TABS: 650.0000 mg | ORAL_TABLET | Freq: Four times a day (QID) | ORAL | Status: DC | PRN

## 2018-02-07 MED ORDER — ENOXAPARIN SODIUM 40 MG/0.4ML ~~LOC~~ SOLN
40.0000 mg | SUBCUTANEOUS | Status: DC
  Administered 2018-02-07: 40 mg via SUBCUTANEOUS
  Filled 2018-02-07: qty 0.4

## 2018-02-07 MED ORDER — SODIUM CHLORIDE 0.9% FLUSH
3.0000 mL | Freq: Two times a day (BID) | INTRAVENOUS | Status: DC
  Administered 2018-02-07 – 2018-02-08 (×2): 3 mL via INTRAVENOUS

## 2018-02-07 MED ORDER — VITAMIN B-1 100 MG PO TABS
100.0000 mg | ORAL_TABLET | Freq: Every day | ORAL | Status: DC
  Administered 2018-02-07 – 2018-02-08 (×2): 100 mg via ORAL
  Filled 2018-02-07 (×2): qty 1

## 2018-02-07 MED ORDER — GABAPENTIN 300 MG PO CAPS: 300.0000 mg | ORAL_CAPSULE | Freq: Three times a day (TID) | ORAL | Status: DC

## 2018-02-07 MED ORDER — INSULIN ASPART 100 UNIT/ML ~~LOC~~ SOLN: 0.0000 [IU] | Freq: Every day | SUBCUTANEOUS | Status: DC

## 2018-02-07 MED ORDER — KETOROLAC TROMETHAMINE 30 MG/ML IJ SOLN
30.0000 mg | Freq: Four times a day (QID) | INTRAMUSCULAR | Status: DC | PRN
  Administered 2018-02-07 – 2018-02-08 (×2): 30 mg via INTRAVENOUS
  Filled 2018-02-07 (×2): qty 1

## 2018-02-07 MED ORDER — PANTOPRAZOLE SODIUM 40 MG PO TBEC: 40.0000 mg | DELAYED_RELEASE_TABLET | Freq: Every day | ORAL | Status: DC

## 2018-02-07 NOTE — ED Triage Notes (Signed)
Pt arrived via [facility] from relatives home. Pt lives in [place] and receives care at [facility] [place]. Per EMS, pt left his mediations at home and has not taken his home meds today. EMS v/s cbg 149, 106/68, 98% ra, hr 50-70. EMS gave 324 ASA,  zofran, and NS bolus PTA.

## 2018-02-07 NOTE — H&P (Signed)
Date: 02/07/2018               Patient Name:  Allen Hernandez MRN: [id number]  DOB: 01-29-1961 Age / Sex: 57 y.o., male   PCP: Billee Cashing, MD         Medical Service: Internal Medicine Teaching Service         Attending Physician: Dr. Burns Spain, MD    First Contact: Brooke Bonito Pager: [phone]  Second Contact: Dr. Arnetha Courser Pager: [phone]       After Hours (After 5p/  First Contact Pager: [phone]  weekends / holidays): Second Contact Pager: [phone]   Chief Complaint: chest pain  History of Present Illness: Mr. Nedeau is a 70 yoM with a past medical history of chronic back pain, cocaine abuse & diabetes who presents with chest pain beginning last night. He describes it as a dull chest pain that woke him up from sleep in the center of his chest with some radiation to the right side. Associated symptoms included night sweats and a slight headache. Two hours prior to bed, he had a dinner of a whopper with cheese, fries, and ice cream.   He denies shortness of breath, fevers, paresthesias, or sick contacts. He has never experienced chest pain in the past though he notes a history of acid reflux. He endorses continued nausea and vomiting as well as continued nightly vomiting for the last month. He knows that he is not eating as he should for his diabetes and has self-discontinued from all prescribed medication for the last month, including his metformin and gabapentin. He states that the metformin gave him GI upset and the gabapentin made him "feel high." He did feel that his sugars were high yesterday, but left his glucometer at home as he is visiting his hometown of [place] from his new residence in [place].  He does not use marijuana, though he is "around it." He does use cocaine, last use yesterday and drinks 2-4 beers weekly. Mr. Skillin knows that he will need to reestablish care. He was previously seen regularly by Dr. Billee Cashing of [facility] who is no  longer in practice, and was not happy with the care he received at [facility]. He has chronic back pain from his severe scoliosis and kyphosis for which he has seen pain management in the past. He states that he would like to establish care in [place] and willing to make the drive to his appointments.  Meds:  Current Facility-Administered Medications for the 02/07/18 encounter [facility] Encounter)  Medication  . insulin aspart (novoLOG) injection 20 Units   No outpatient medications have been marked as taking for the 02/07/18 encounter [facility] Encounter).    Allergies: Allergies as of 02/07/2018  . (No Known Allergies)   Past Medical History:  Diagnosis Date  . Diabetes (HCC)    recently diagnosed - 3 months ago  . Neuropathy due to secondary diabetes (HCC) 08/2014  . Pneumonia   . Renal disorder   . Respiratory failure (HCC)     Family History: Liver/renal cancer in mom. DM in brother. CAD & cancer in father.  Social History: Mr. Bahe is newly relocated to [facility] from [place] where he grew up. A son of his was murdered 7 years ago. His mother passed away last month. He is a frequent cocaine user, last use yesterday. Occasionally drinks alcohol ~4 beers/week. Smokes 5 cigarettes daily.  Review of Systems: A complete ROS was negative except as per HPI.  Physical Exam: Blood pressure 95/62, pulse (!) 59, resp. rate 12, SpO2 97 %.  Physical Exam  Constitutional: He is oriented to person, place, and time. He appears well-developed and well-nourished.  HENT:  Head: Normocephalic and atraumatic.  Eyes: EOM are normal.  Cardiovascular: Normal rate and regular rhythm.  Chest wall is TTP.  Pulmonary/Chest: Effort normal and breath sounds normal.  Abdominal: Soft. Bowel sounds are normal.  Neurological: He is alert and oriented to person, place, and time.  Skin: Skin is warm and dry.  Psychiatric: He has a normal mood and affect. His behavior is normal.    EKG: personally  reviewed my interpretation is no significant change from priors (2017, 2018).  CXR: personally reviewed my interpretation is WNL.  Lab Results  Component Value Date   WBC 7.2 02/07/2018   HGB 12.4 (L) 02/07/2018   HCT 37.9 (L) 02/07/2018   MCV 86.3 02/07/2018   PLT 301 02/07/2018   Lab Results  Component Value Date   NA 140 02/07/2018   K 4.2 02/07/2018   CL 108 02/07/2018   CO2 26 02/07/2018    Troponin: 0.01 @ 1430  Assessment & Plan by Problem: Active Problems:   Chest pain  #Chest Pain: Clinical picture not terribly concerning for cardiac etiology though this possibility cannot be ruled out. Troponins remain negative at this time. CXR WNL. Admission EKG in sinus with no changes from previous. All VS WNL. Postprandial nature, history of nightly emesis, acid reflux, tenderness to palpation of the chest wall are more convincing for GERD, though we will continue to work up other etiologies. - Received ASA  in ED - Continue to trend troponins; initial 0.01 - Famotidine 20 mg BID  #Diabetes: Patient has self-discontinued metformin for the past month due to GI upset. Blood glucose on admission was 131. He has also stopped taking his gabapentin, prescribed for peripheral neuropathy. Chart review reveals he has been on glipizide in the past. He will need to follow up with a PCP to develop an ideal regimen. - SSI - HgbA1c pending  #Back Pain: Patient has a history of scoliosis which was corrected by surgery, according to him he was using oxycodone for his chronic back pain for long time, apparently his physician from [facility] refused to give him another prescription for oxycodone and he do not want to go back to him.  [place] database was checked and his last fill of oxycodone 10 mg for 90 tablets was on November 19, 2017, before that he was filling every month , 25-month ago the dose was decreased from 15 mg 3 times daily to 10 mg 3 times daily.  Last year in June number of tablets were  decreased from 120 to 90.   Tylenol  prn has been made available. -Toradol as needed.  #PPX: Lovenox #Diet: Carb consistent  Dispo: Admit patient to Observation with expected length of stay less than 2 midnights.  Signed: Dow Adolph, Medical Student 02/07/2018, 5:41 PM  Pager: [phone]  Attestation for Student Documentation:  I personally was present and performed or re-performed the history, physical exam and medical decision-making activities of this service and have verified that the service and findings are accurately documented in the student's note.  Arnetha Courser, MD 02/07/2018, 7:55 PM

## 2018-02-07 NOTE — ED Provider Notes (Signed)
[facility] [place] EMERGENCY DEPARTMENT Provider Note   CSN: [id number] Arrival date & time: 02/07/18  1212     History   Chief Complaint No chief complaint on file.   HPI Allen Hernandez is a 57 y.o. male.  HPI   Allen Hernandez is a 57yo male with a history of type 2 diabetes, tobacco use, peripheral neuropathy and scoliosis who presents to the emergency department via EMS for evaluation of chest pain.  Patient reports that he developed substernal chest pain which woke him from sleep overnight.  He states that chest pain felt dull and aching in nature and radiated to the right shoulder.  States that he also had associated shortness of breath, nausea with one episode of emesis and felt lightheaded and diaphoretic.  He reports symptoms have been constant which prompted his family members to call EMS.  He is visiting from [place], reports that he has not taken any of his medications today.  Per report EMS gave 324 ASA, 4 mg Zofran and 500 mL normal saline bolus prior to arrival.  Patient reports that his pain is now 5/10 in severity over the sternum, states that it is no longer radiating.  Reports that he has a tingling sensation in his right first through third fingers which has been present for 2 or 3 weeks now.  States that he thinks the tingling may be a little bit worse than it was before.  He also is reporting 10/10 severity lower back pain, reports he has chronic pain from a prior scoliosis surgery in the 1980s.  Normally takes oxycodone but has not taken this today.  He denies fevers, chills, cough, wheezing, leg swelling, weakness, abdominal pain, diarrhea, dysuria, urinary frequency, syncope, loss of bowel or bladder control, saddle anesthesia.  Reports that he has smoked for 40 years now.  Reports his father had a heart attack in his 60s.  Denies history of DVT/PE, unilateral leg swelling or calf tenderness, recent surgery or immobilization, pleuritic pain.  Past Medical History:    Diagnosis Date  . Diabetes (HCC)    recently diagnosed - 3 months ago  . Neuropathy due to secondary diabetes (HCC) 08/2014  . Pneumonia   . Renal disorder   . Respiratory failure [facility])     Patient Active Problem List   Diagnosis Date Noted  . Chronic myofascial pain 02/19/2015  . 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 Type 2 diabetes mellitus without complication (HCC) 07/13/2014  . Scoliosis (and kyphoscoliosis), idiopathic 11/04/2013  . Low back pain 11/04/2013    Past Surgical History:  Procedure Laterality Date  . SPINE SURGERY          Home Medications    Prior to Admission medications   Medication Sig Start Date End Date Taking? Authorizing Provider  famotidine (PEPCID) 20 MG tablet Take 1 tablet (20 mg total) by mouth 2 (two) times daily. 04/04/17   Renne Crigler, PA-C  gabapentin (NEURONTIN) 300 MG capsule Take 1 capsule (300 mg total) by mouth 3 (three) times daily. Patient taking differently: Take 300 mg by mouth at bedtime.  01/04/15   Quentin Angst, MD  glipiZIDE (GLUCOTROL) 5 MG tablet Take 2.5 mg by mouth daily. 09/07/16   [provider]  omeprazole (PRILOSEC) 20 MG capsule Take 20 mg by mouth daily.    [provider]  ondansetron (ZOFRAN ODT) 4 MG disintegrating tablet Take 1 tablet (4 mg total) by mouth every 8 (eight) hours as needed for nausea or  vomiting. 04/04/17   Renne Crigler, PA-C  oxyCODONE (ROXICODONE) 15 MG immediate release tablet TK 1 T PO TID 04/03/17   [provider]  sucralfate (CARAFATE) 1 g tablet Take 1 tablet (1 g total) by mouth 4 (four) times daily -  with meals and at bedtime. 04/04/17   Renne Crigler, PA-C    Family History Family History  Problem Relation Age of Onset  . Cancer Mother   . Cancer Father   . Diabetes Brother     Social History Social History   Tobacco Use  . Smoking status: Current Every Day Smoker    Packs/day: 0.25    Types: Cigarettes  . Smokeless tobacco: Never Used   Substance Use Topics  . Alcohol use: Yes    Alcohol/week: 0.0 oz    Comment: occasionally  . Drug use: No     Allergies   Patient has no known allergies.   Review of Systems Review of Systems  Constitutional: Positive for diaphoresis. Negative for chills and fever.  HENT: Negative for congestion and sore throat.   Eyes: Negative for visual disturbance.  Respiratory: Positive for shortness of breath. Negative for cough, chest tightness and wheezing.   Cardiovascular: Positive for chest pain. Negative for leg swelling.  Gastrointestinal: Positive for nausea and vomiting. Negative for abdominal pain, blood in stool and diarrhea.  Genitourinary: Negative for difficulty urinating, flank pain, frequency and hematuria.  Musculoskeletal: Positive for back pain.  Skin: Negative for rash.  Neurological: Positive for light-headedness and numbness (right 1st-3rd fingers for the past 3 wks). Negative for syncope, speech difficulty, weakness and headaches.     Physical Exam Updated Vital Signs BP 112/71 (BP Location: Left Arm)   Pulse (!) 59   Temp 98.4 F (36.9 C) (Oral)   Resp 17   Ht 5' 9.5" (1.765 m)   Wt 63.5 kg (140 lb)   SpO2 98%   BMI 20.38 kg/m   Physical Exam  Constitutional: He is oriented to person, place, and time. He appears well-developed and well-nourished. No distress.  Sitting at bedside in no apparent distress, nontoxic-appearing.  HENT:  Head: Normocephalic and atraumatic.  Mouth/Throat: Oropharynx is clear and moist. No oropharyngeal exudate.  Eyes: Pupils are equal, round, and reactive to light. Conjunctivae are normal. Right eye exhibits no discharge. Left eye exhibits no discharge.  Neck: Normal range of motion. Neck supple. No JVD present. No tracheal deviation present.  Cardiovascular: Normal rate and regular rhythm. Exam reveals no friction rub.  No murmur heard. Radial pulses 2+ bilaterally.  Pulmonary/Chest: Effort normal and breath sounds normal.  No stridor. No respiratory distress. He has no wheezes. He has no rales.  Abdominal: Soft. Bowel sounds are normal. There is no tenderness.  Musculoskeletal:  Midline well-healed scar from the thoracic to lumbar spine.  Patient tender to palpation over several spinous processes of the lumbar spine as well as bilateral paraspinal muscles of the lumbar spine.  No leg swelling or calf tenderness.   Neurological: He is alert and oriented to person, place, and time. Coordination normal.  Distal sensation to light touch intact in bilateral upper and lower extremities.  Strength 5/5 bilateral upper and lower extremities including grip strength and ankle dorsiflexion/plantarflexion.  DP pulses 2+ bilaterally.  Skin: Skin is warm and dry. He is not diaphoretic.  Psychiatric: He has a normal mood and affect. His behavior is normal.  Nursing note and vitals reviewed.    ED Treatments / Results  Labs (all labs  ordered are listed, but only abnormal results are displayed) Labs Reviewed  CBC - Abnormal; Notable for the following components:      Result Value   Hemoglobin 12.4 (*)    HCT 37.9 (*)    All other components within normal limits  BASIC METABOLIC PANEL - Abnormal; Notable for the following components:   Glucose, Bld 131 (*)    All other components within normal limits  I-STAT TROPONIN, ED    EKG EKG Interpretation  Date/Time:  Thursday Feb 07 2018 12:15:03 EDT Ventricular Rate:  55 PR Interval:    QRS Duration: 93 QT Interval:  456 QTC Calculation: 437 R Axis:   82 Text Interpretation:  Sinus rhythm Abnrm T, consider ischemia, anterolateral lds No significant change since last tracing Confirmed by Shaune Pollack [phone]) on 02/07/2018 3:30:43 PM   Radiology Dg Chest 2 View  Result Date: 02/07/2018 CLINICAL DATA:  Chest pain and shortness of breath EXAM: CHEST - 2 VIEW COMPARISON:  April 04, 2017 FINDINGS: There is no edema or consolidation. The heart size and pulmonary vascularity  are normal. No adenopathy. There are fixation rods in the thoracic spine with dextroscoliosis, stable. IMPRESSION: Persistent scoliosis.  No edema or consolidation. Electronically Signed   By: Bretta Bang III M.D.   On: 02/07/2018 14:21    Procedures Procedures (including critical care time)  Medications Ordered in ED Medications - No data to display   Initial Impression / Assessment and Plan / ED Course  I have reviewed the triage vital signs and the nursing notes.  Pertinent labs & imaging results that were available during my care of the patient were reviewed by me and considered in my medical decision making (see chart for details).    Patient with a history of type 2 diabetes and tobacco use presents to emergency department for evaluation of substernal chest pain.  He states the pain woke him up in the middle the night and had associated shortness of breath, diaphoresis and one episode of emesis.  Reports tingling sensation in his right fingertips, although this is been present for several weeks now and thought to be related to his diabetic peripheral neuropathy.  Also reporting lower back pain, has a history of chronic back pain and has not taken his oxycodone today. No loss of bowel bladder control, numbness in the LE, weakness.   Initial troponin negative, EKG without ischemic changes. He has a HEART score of 4. Do not suspect PE given no tachycardia, tachypnea or PE risk factors. No widening of mediastinum on CXR, no pulse deficits, no new murmur, he has tingling sensation in his right 1-3rd fingers but this has been chronic for several weeks now and likely related to diabetic peripheral neuropathy. Do not suspect TAD. Patient afebrile and in NAD while in the ED,no concern for esophageal perforation.   In terms of his back pain, feel that this is related to his chronic pain in the setting of him not taking his regularly prescribed oxycodone today. No loss of bowel or bladder  control, no weakness or numbness in the LE and no concern for cauda equina.   Plan to admit patient to IM Resident Service for CP ACS r/o given his HEART score of 4. Discussed this patient with Dr. Erma Heritage who agrees with this plan to admit. Patient informed and agrees with this plan.   Final Clinical Impressions(s) / ED Diagnoses   Final diagnoses:  None    ED Discharge Orders    None  Kellie Shropshire, PA-C 02/08/18 1630    Shaune Pollack, MD 02/09/18 [phone]

## 2018-02-07 NOTE — ED Notes (Signed)
Patient transported to X-ray 

## 2018-02-08 ENCOUNTER — Other Ambulatory Visit: Payer: Self-pay

## 2018-02-08 ENCOUNTER — Encounter (HOSPITAL_COMMUNITY): Payer: Self-pay | Admitting: *Deleted

## 2018-02-08 DIAGNOSIS — E1142 Type 2 diabetes mellitus with diabetic polyneuropathy: Secondary | ICD-10-CM | POA: Diagnosis not present

## 2018-02-08 DIAGNOSIS — G8929 Other chronic pain: Secondary | ICD-10-CM | POA: Diagnosis not present

## 2018-02-08 DIAGNOSIS — K219 Gastro-esophageal reflux disease without esophagitis: Secondary | ICD-10-CM | POA: Diagnosis not present

## 2018-02-08 DIAGNOSIS — R0789 Other chest pain: Secondary | ICD-10-CM | POA: Diagnosis not present

## 2018-02-08 LAB — LIPASE, BLOOD: Lipase: 39 U/L (ref 11–51)

## 2018-02-08 LAB — HIV ANTIBODY (ROUTINE TESTING W REFLEX): HIV Screen 4th Generation wRfx: NONREACTIVE

## 2018-02-08 LAB — GLUCOSE, CAPILLARY
Glucose-Capillary: 234 mg/dL — ABNORMAL HIGH (ref 65–99)
Glucose-Capillary: 234 mg/dL — ABNORMAL HIGH (ref 65–99)

## 2018-02-08 LAB — TROPONIN I

## 2018-02-08 MED ORDER — ASPIRIN 81 MG PO TBEC: 81.0000 mg | DELAYED_RELEASE_TABLET | Freq: Every day | ORAL | 0 refills | Status: DC

## 2018-02-08 MED ORDER — PANTOPRAZOLE SODIUM 40 MG PO TBEC
40.0000 mg | DELAYED_RELEASE_TABLET | Freq: Every day | ORAL | Status: DC
  Administered 2018-02-08: 40 mg via ORAL
  Filled 2018-02-08: qty 1

## 2018-02-08 MED ORDER — PIOGLITAZONE HCL 15 MG PO TABS: 15.0000 mg | ORAL_TABLET | Freq: Every day | ORAL | 11 refills | Status: DC

## 2018-02-08 MED ORDER — METOCLOPRAMIDE HCL 5 MG PO TABS: 5.0000 mg | ORAL_TABLET | Freq: Three times a day (TID) | ORAL | 0 refills | Status: DC

## 2018-02-08 MED ORDER — METOCLOPRAMIDE HCL 5 MG PO TABS
5.0000 mg | ORAL_TABLET | Freq: Three times a day (TID) | ORAL | Status: DC
  Administered 2018-02-08: 5 mg via ORAL
  Filled 2018-02-08: qty 1

## 2018-02-08 MED ORDER — GI COCKTAIL ~~LOC~~
30.0000 mL | Freq: Once | ORAL | Status: AC
  Administered 2018-02-08: 30 mL via ORAL
  Filled 2018-02-08: qty 30

## 2018-02-08 NOTE — Discharge Summary (Signed)
Name: Allen Hernandez MRN: [id number] DOB: October 15, 1960 57 y.o. PCP: Billee Cashing, MD  Date of Admission: 02/07/2018 12:12 PM Date of Discharge: 02/08/2018 Attending Physician: Blanch Media MD  Discharge Diagnosis: 1. GI upset/GERD/gastroparesis 2. Cocaine abuse 3. Lack of primary care provider  Principal Problem:   Chest pain Active Problems:   Low back pain   Type 2 diabetes mellitus without complication [facility])   Discharge Medications: Allergies as of 02/08/2018   No Known Allergies     Medication List    STOP taking these medications   glipiZIDE 5 MG tablet Commonly known as:  GLUCOTROL   metFORMIN 500 MG tablet Commonly known as:  GLUCOPHAGE   ondansetron 4 MG disintegrating tablet Commonly known as:  ZOFRAN ODT   sucralfate 1 g tablet Commonly known as:  CARAFATE     TAKE these medications   famotidine 20 MG tablet Commonly known as:  PEPCID Take 1 tablet (20 mg total) by mouth 2 (two) times daily. Notes to patient:  No recent doses, take as ordered.   gabapentin 300 MG capsule Commonly known as:  NEURONTIN Take 1 capsule (300 mg total) by mouth 3 (three) times daily. Notes to patient:  No recent doses, take as ordered.   metoCLOPramide 5 MG tablet Commonly known as:  REGLAN Take 1 tablet (5 mg total) by mouth 3 (three) times daily before meals. Notes to patient:  First dose given 02/08/2018 @ 12:00pm   omeprazole 20 MG capsule Commonly known as:  PRILOSEC Take 20 mg by mouth daily. Notes to patient:  No recent doses, take as ordered.   oxyCODONE 15 MG immediate release tablet Commonly known as:  ROXICODONE TK 1 T PO TID Notes to patient:  No recent doses, take as ordered.   pioglitazone 15 MG tablet Commonly known as:  ACTOS Take 1 tablet (15 mg total) by mouth daily. Notes to patient:  No recent doses, take as ordered.       Disposition and follow-up:   Allen Hernandez was discharged from [facility] in Good  condition.  At the hospital follow up visit please address:  1.  Resolution of N/V/Abdominal pain, possible gastroparesis. He also needs to establish care.  2.  Labs / imaging needed at time of follow-up: gastric emptying study, repeat A1c in three months  3.  Pending labs/ test needing follow-up: none  Follow-up Appointments: Follow-up Information    System, Provider Not In .           Hospital Course by problem list: Principal Problem:   Chest pain Active Problems:   Low back pain   Type 2 diabetes mellitus without complication [facility])   Allen Hernandez is a 23 yoM with a PMHx of DM, chronic low back pain s/p scoliosis surgery, and cocaine use who presented with an episode of postprandial chest pain.  #Chest Pain, noncardiac: Allen Hernandez presented to the ED approximately 12-16 hours after experiencing chest pain while laying down at home the night before. His clinical picture was not terribly concerning for cardiac etiology given no changes on his EKG (normal sinus, identical to previous) but troponins were still trended and remained negative during the admission. In the ED, he was given  ASA but this was discontinued by discharge as long term prophylaxis is not indicated. Chest X-ray was WNL as were all vital signs during this admission. The postprandial nature of his pain, history of nightly emesis, acid reflux, tenderness to palpation of  the chest wall were more convincing for a gastric etiology of his pain (more below). Notably, CP did not recur while admitted. He was started on an H2 blocker and PPI both in the hospital and at discharge.  #Diabetes: Patient self-discontinued metformin for the past month due to GI upset. Blood glucose on admission was 131. He has also stopped taking his gabapentin, prescribed for peripheral neuropathy. Chart review reveals he has been on glipizide in the past. Not unexpectedly, his A1c was elevated to A1c during this admission. He was treated with SSI  while admitted. He will need to follow up with a PCP to develop an ideal regimen, but has been placed on pioglitazone  qd at time of discharge. On day of discharge, Allen Hernandez divulged a years-long history of abdominal pain, nausea and vomiting which results in him vomiting undigested food after meals at night. His symptoms may be consistent with gastroparesis which may be investigated as an outpatient via gastric emptying study. He was prescribed metoclopramide at time of discharge and encouraged to try eating smaller meals and wait a few hours before retiring to bed.  #Back Pain: Patient has a history of scoliosis which was corrected by surgery, according to him he was using oxycodone for his chronic back pain for long time, apparently his physician from [facility] refused to give him another prescription for oxycodone and he do not want to go back to him.  [place] database was checked and his last fill of oxycodone 10 mg for 90 tablets was on November 19, 2017, before that he was filling every month , 61-month ago the dose was decreased from 15 mg 3 times daily to 10 mg 3 times daily. Last year in June number of tablets were decreased from 120 to 90. Pain was managed during this admission with tylenol and toradol as needed.  #PCP: An appointment was made to establish care with [facility] with Dr. Wonda Olds for Friday, 5/24 at 9:30a [phone]). This information was left on patient's VM and with his emergency contact.  Discharge Vitals:   BP 112/71 (BP Location: Left Arm)   Pulse (!) 59   Temp 98.4 F (36.9 C) (Oral)   Resp 17   Ht 5' 9.5" (1.765 m)   Wt 140 lb (63.5 kg)   SpO2 98%   BMI 20.38 kg/m   Pertinent Labs, Studies, and Procedures:  A1c = 8.2 Negative Trops x3, normal EKG  Discharge Instructions: Discharge Instructions    Diet - low sodium heart healthy   Complete by:  As directed    Discharge instructions   Complete by:  As directed    It was  pleasure taking care of you. As we discussed it is really important that you get a primary care physician and keep your diabetes under good control. You can call the back of your insurance card and they should be able to help you getting a good primary care in the your area. I am starting you on a new medicine for your diabetes called Actos, take it daily, your primary care physician should be able to monitor your progress or make some changes in your medication. For your stomach pain you can continue using omeprazole and Pepcid that should help with pain. I am adding a new nausea medicine called Reglan you can take it up to 3 times a day before meals that will help with your nausea and your stomach to get empty little quickly. Your  primary care physician should be able to schedule a study to make sure that food is not staying for long time in your stomach which is a common complication of diabetes and treat accordingly. Do not go to bed immediately after eating, try to stay upright at least 2 to 3-hour. Eating small frequent meals should help with your stomach problem. As we also discussed stop using cocaine as that can cause an stroke or heart attack.   Increase activity slowly   Complete by:  As directed       Signed: Dow Adolph, Medical Student 02/08/2018, 4:09 PM   Pager: [phone]  Attestation for Student Documentation:  I personally was present and performed or re-performed the history, physical exam and medical decision-making activities of this service and have verified that the service and findings are accurately documented in the student's note.  Arnetha Courser, MD 02/09/2018, 3:11 PM

## 2018-02-08 NOTE — Progress Notes (Signed)
Subjective: [name] seems to be doing much better this morning. He denies any additional chest pain, nausea or vomiting but does report some abdominal pain following breakfast.  Objective:  Vital signs in last 24 hours: Vitals:   02/07/18 2000 02/07/18 2038 02/08/18 0426 02/08/18 1149  BP:  108/76 104/71 112/71  Pulse: 78 71 74 (!) 59  Resp: (!) 21 (!) Temp:  98.9 F (37.2 C) 98.8 F (37.1 C) 98.4 F (36.9 C)  TempSrc:  Oral Oral Oral  SpO2: 98% 98% 94% 98%  Weight: 140 lb (63.5 kg)     Height:        Physical Exam  Constitutional: He is oriented to person, place, and time. He appears well-developed and well-nourished.  HENT:  Head: Normocephalic and atraumatic.  Eyes: EOM are normal.  Cardiovascular: Normal rate and regular rhythm.  Chest wall is TTP.  Pulmonary/Chest: Effort normal and breath sounds normal.  Abdominal: Soft. Bowel sounds are normal.  Neurological: He is alert and oriented to person, place, and time.  Skin: Skin is warm and dry.  Psychiatric: He has a normal mood and affect. His behavior is normal.   Lab Results  Component Value Date   WBC 6.5 02/07/2018   HGB 12.7 (L) 02/07/2018   HCT 38.5 (L) 02/07/2018   PLT 306 02/07/2018   GLUCOSE 131 (H) 02/07/2018   CHOL 150 11/04/2013   TRIG 63 11/04/2013   HDL 50 11/04/2013   LDLCALC 87 11/04/2013   ALT 22 04/04/2017   AST 20 04/04/2017   NA 140 02/07/2018   K 4.2 02/07/2018   CL 108 02/07/2018   CREATININE 1.15 02/07/2018   BUN 14 02/07/2018   CO2 26 02/07/2018   TSH 0.686 03/16/2014   HGBA1C 8.2 (H) 02/07/2018   MICROALBUR 0.88 03/16/2014     Assessment/Plan:  Active Problems:   Chest pain  #Chest Pain: Clinical picture not terribly concerning for cardiac etiology though this possibility cannot be ruled out. Troponins remain negative at this time. CXR WNL. Admission EKG in sinus with no changes from previous. All VS WNL. Postprandial nature, history of nightly emesis, acid  reflux, tenderness to palpation of the chest wall are more convincing for GERD or gastroparesis. Notably, CP has not recurred - Discontinue ASA - troponins negative x 0.03 - Famotidine 20 mg BID, start PPI po  #Diabetes: Patient has self-discontinued metformin for the past month due to GI upset. Blood glucose on admission was 131. He has also stopped taking his gabapentin, prescribed for peripheral neuropathy. Chart review reveals he has been on glipizide in the past. He will need to follow up with a PCP to develop an ideal regimen. - Begin Actose qd (pioglitazone) - SSI while admitted - HgbA1c 8.2  #Back Pain: Patient has a history of scoliosis which was corrected by surgery, according to him he was using oxycodone for his chronic back pain for long time, apparently his physician from [facility] refused to give him another prescription for oxycodone and he do not want to go back to him.  [place] database was checked and his last fill of oxycodone 10 mg for 90 tablets was on November 19, 2017, before that he was filling every month , 2-month ago the dose was decreased from 15 mg 3 times daily to 10 mg 3 times daily.  Last year in June number of tablets were decreased from 120 to 90.   Tylenol  prn has been made available. -  Toradol as needed.   Dispo: Anticipated discharge in approximately 0 day(s).   Dow Adolph, Medical Student 02/08/2018, 2:04 PM Pager: [phone]  Attestation for Student Documentation:  I personally was present and performed or re-performed the history, physical exam and medical decision-making activities of this service and have verified that the service and findings are accurately documented in the student's note.  Arnetha Courser, MD 02/09/2018, 3:10 PM

## 2018-02-08 NOTE — Progress Notes (Signed)
Spoke w patient discussed following up with [employer] by calling number on card or visiting website to find names of PCP in area who take insurance. Patient verbalized understanding

## 2018-02-08 NOTE — Progress Notes (Signed)
  Date: 02/08/2018  Patient name: Allen Hernandez  Medical record number: [id number]  Date of birth: 1961/06/08   I have seen and evaluated Allen Hernandez and discussed their care with the Residency Team. Allen Hernandez is a 57 yo man with untreated DM and cocaine use who presented for CP. The CP has resolved and was felt to be non cardiac. Today, she shares a several yr h/o abd pain, N, and V which is stable and not worse today. Freq at night and mostly post prandial. Vomites undigested food most nights. Only occ with blood. Also describes some substernal burning. Has never had this investigated.  PMHx, Fam Hx, and/or Soc Hx : Moved to [place], no PCP. Denies drug use.   Vitals:   02/08/18 0426 02/08/18 1149  BP: 104/71 112/71  Pulse: 74 (!) 59  Resp: 10 17  Temp: 98.8 F (37.1 C) 98.4 F (36.9 C)  SpO2: 94% 98%  Appears very comfortable, NAD, speaking in full sentences, no difficulty handling oral secretions.  HRRR no MRG LCTAB with good air flow, no wheezing  A1C 8.2 UDS + cocaine Trop - x3  I personally viewed the CXR images and confirmed my reading with the official read. 2 view, AP and lateral, lateral scoliosis with hardware, no infiltrate  I personally viewed the EKG and confirmed my reading with the official read. Sinus, nl axis, inv T AvL, V1, and V2  Assessment and Plan: I have seen and evaluated the patient as outlined above. I agree with the formulated Assessment and Plan as detailed in the residents' note, with the following changes: Allen Hernandez is a 57 yo man with untreated DM and cocaine use who presented with CP. Life threatening causes were R/O with hx and/or PE and /or study results. He needs no further cardiac W/U but it would be prudent to stop cocaine use and control his DM.   His DM is uncontrolled and he had GI SE to metformin. Agree that actos is a good starting oral med as it might get him to goal as monotherapy. Will need outpt F/U.  His most pressing issue now  seems to be yrs of mostly postprandial N/V/Abd pain along with GERD. He is now on a PPI and can continue this for 3 months then wean off. He will need lifestyle changes and we informed him not to lie down after eating. 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 I would encourage outpt GES. He has been educated on importance of smaller, more freq meals DM control.  1. D/C ASA as no role for primary prevention 2.  PPI 3. Reglan 4. Dietary manipulation 5. Outpt GES 6. Actos 7. Get PCP in [place] 8. D/C home  Burns [place], MD 5/3/201912:40 PM

## 2018-03-27 ENCOUNTER — Other Ambulatory Visit: Payer: Self-pay | Admitting: Internal Medicine

## 2018-04-30 IMAGING — MR MR MRA NECK WO/W CM
12 of 16 series · 28 of 48 positions shown · IV contrast (multihance)
Comparison: CT head earlier today.

CLINICAL DATA: Loss of consciousness.  Syncope.

EXAM:
MRI HEAD WITHOUT   CONTRAST
MRA HEAD WITHOUT CONTRAST
MRA NECK WITHOUT AND WITH CONTRAST
TECHNIQUE: Multiplanar, multiecho pulse sequences of the brain and surrounding
structures were obtained without intravenous contrast. Angiographic
images of the Circle of Willis were obtained using MRA technique
without intravenous contrast. Angiographic images of the neck were
obtained using MRA technique without and with intravenous contrast.
Carotid stenosis measurements (when applicable) are obtained
utilizing NASCET criteria, using the distal internal carotid
diameter as the denominator.
CONTRAST:  12mL MULTIHANCE GADOBENATE DIMEGLUMINE 529 MG/ML IV SOLN

[Series 3: T1 · sagittal · 5.0mm · 0.47mm/px · 1 of 25 slices shown]
[im 1/25]
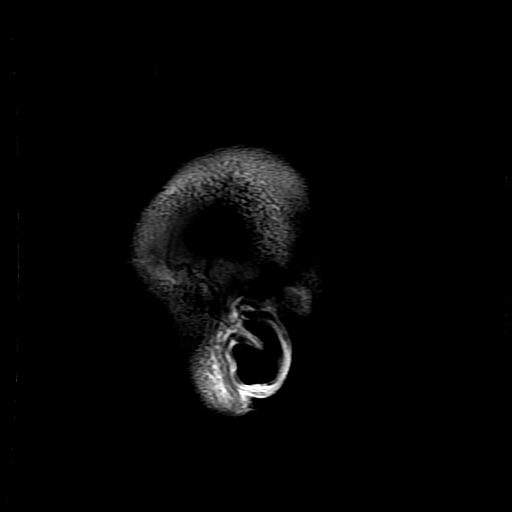

[Series 4: DWI · axial · 3.0mm · 1.09mm/px · z∈[-118,+22]mm · 5 of 98 slices shown (1 of 4)]
[im 1/98]
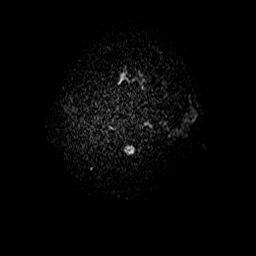
[im 25/98]
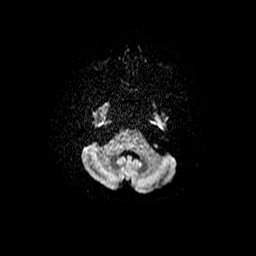
[im 49/98]
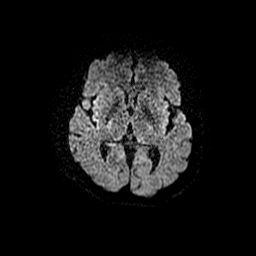
[im 73/98]
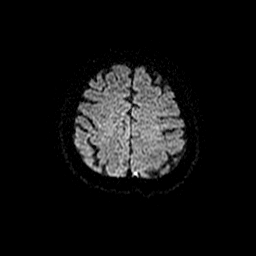
[im 98/98]
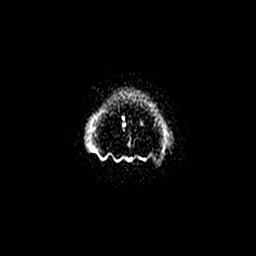

[Series 5: (id) mt fs · axial · 1.4mm · 0.43mm/px · z∈[-76,+18]mm · 7 of 136 slices shown]
[im 1/136]
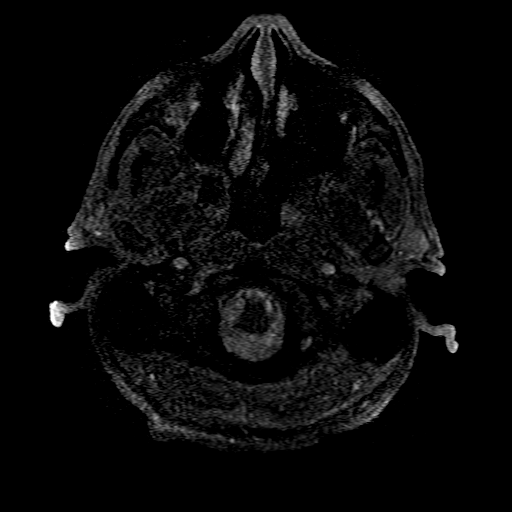
[im 23/136]
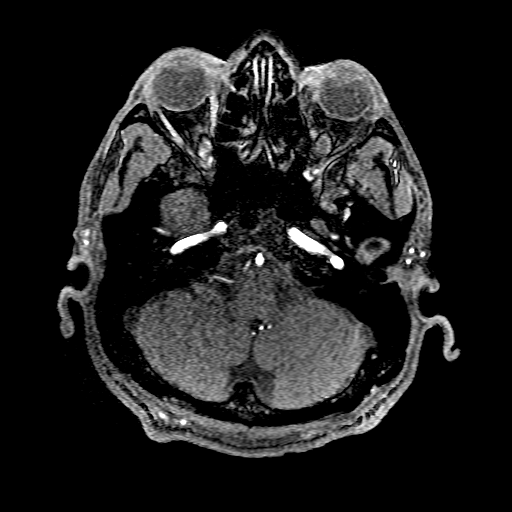
[im 46/136]
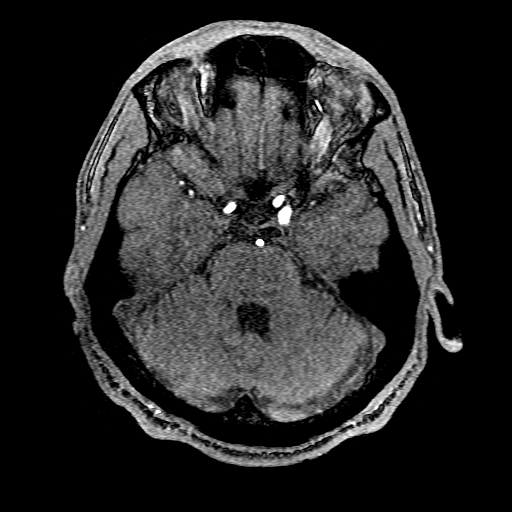
[im 68/136]
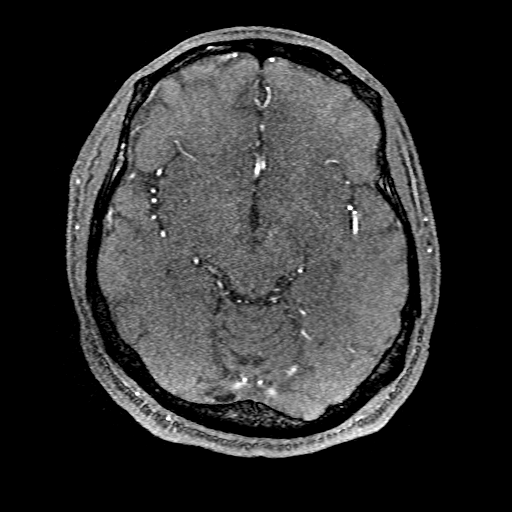
[im 91/136]
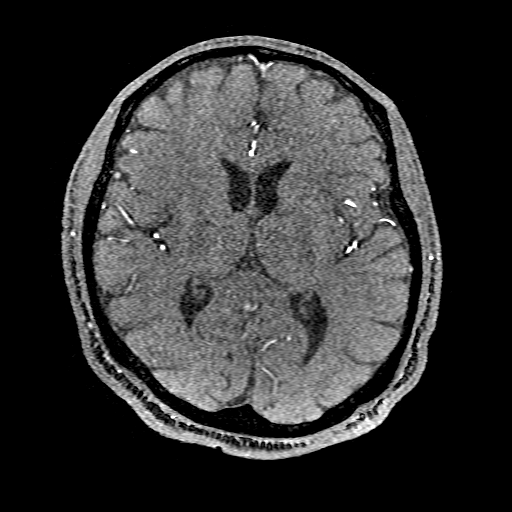
[im 113/136]
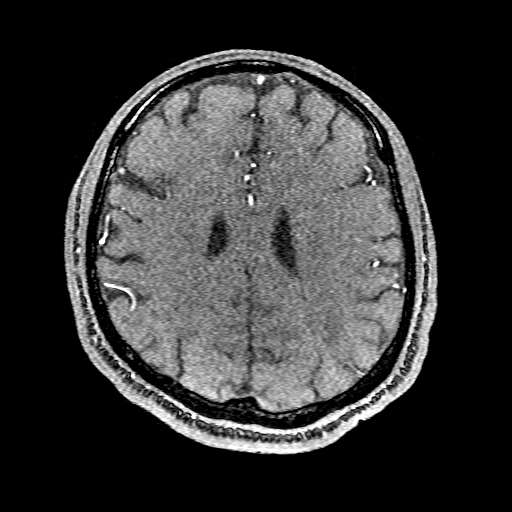
[im 136/136]
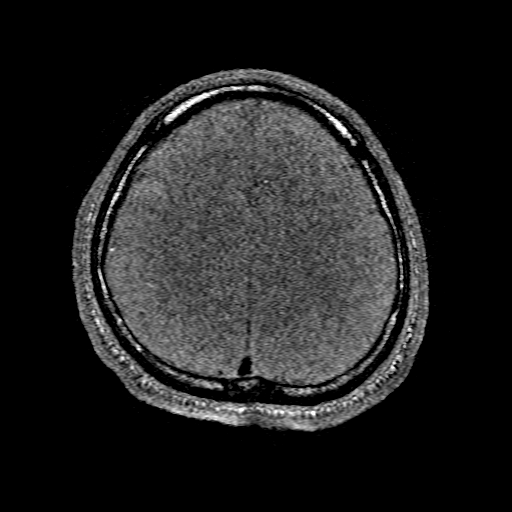

[Series 6: T2 · axial · 5.0mm · 0.47mm/px · 1 of 24 slices shown (1 of 2)]
[im 1/24]
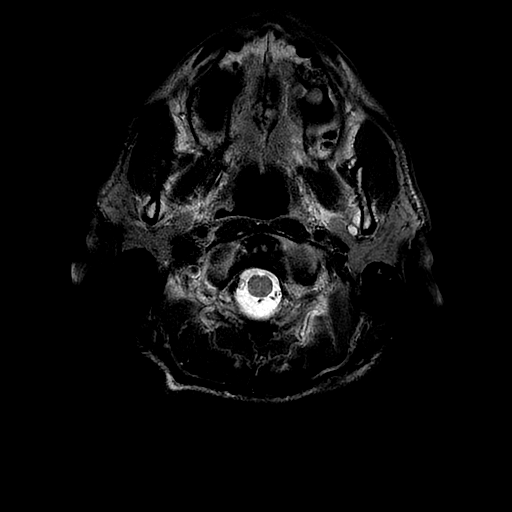

[Series 7: FLAIR · axial · 5.0mm · 0.47mm/px · 1 of 24 slices shown]
[im 1/24]
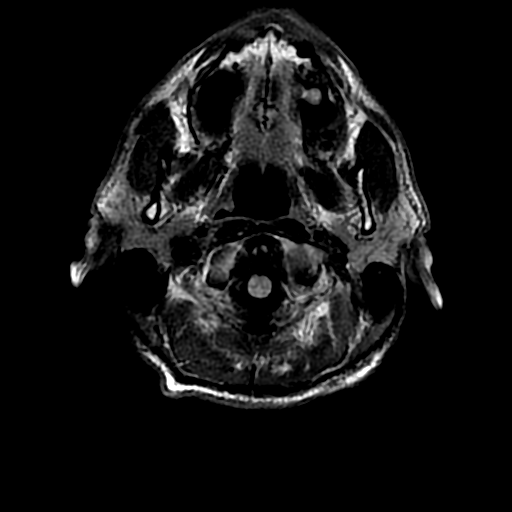

[Series 8: DWI · coronal · 5.0mm · 1.09mm/px · 4 of 66 slices shown (2 of 4)]
[im 1/66]
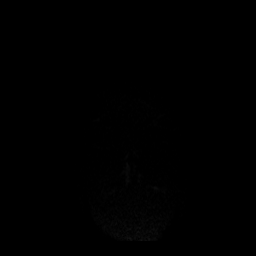
[im 22/66]
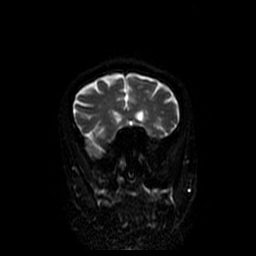
[im 44/66]
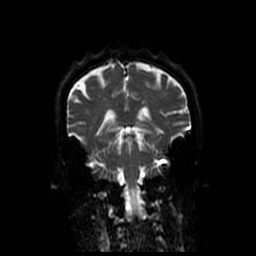
[im 66/66]
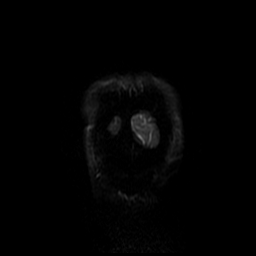

[Series 9: ax mpgr · axial · 5.0mm · 0.47mm/px · 1 of 21 slices shown]
[im 1/21]
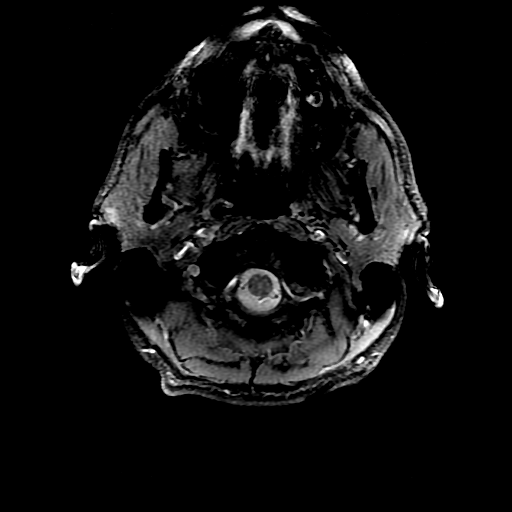

[Series 11: T2 · coronal · 5.0mm · 0.39mm/px · 1 of 25 slices shown (2 of 2)]
[im 1/25]
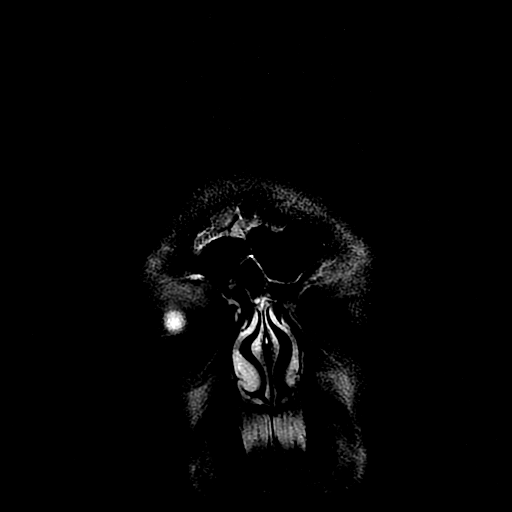

[Series 400: DWI · axial · 3.0mm · 1.09mm/px · z∈[-118,+22]mm · 3 of 49 slices shown (3 of 4)]
[im 1/49]
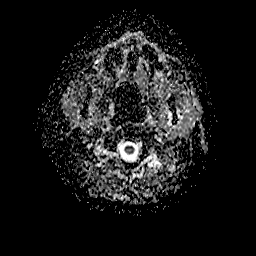
[im 25/49]
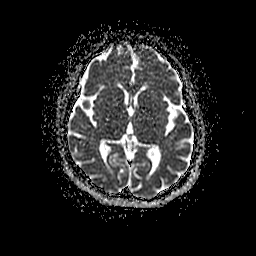
[im 49/49]
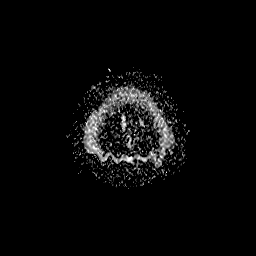

[Series 800: DWI · coronal · 5.0mm · 1.09mm/px · 2 of 33 slices shown (4 of 4)]
[im 1/33]
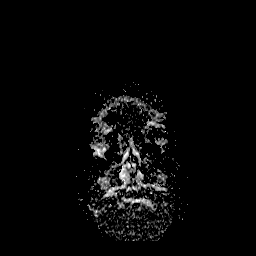
[im 33/33]
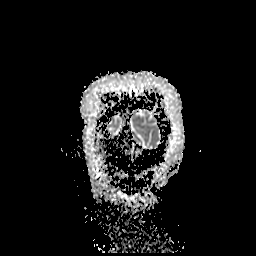

[Series 1400: col:ax (id) · axial · 2.8mm · 0.47mm/px · 1 of 1 slices shown]
[im 1/1]
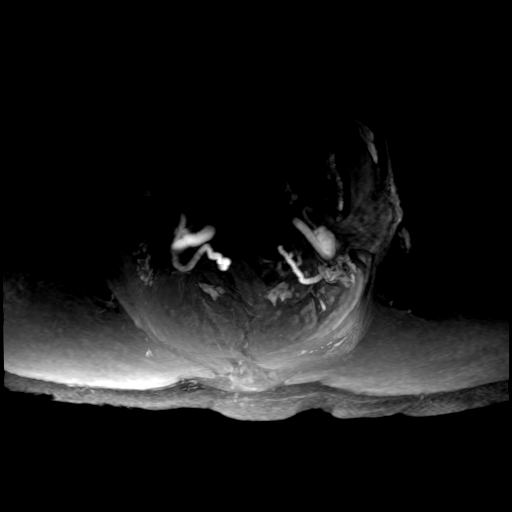

[Series 1401: pjn:ax (id) · sagittal · 2.8mm · 0.47mm/px · 1 of 19 slices shown]
[im 1/19]
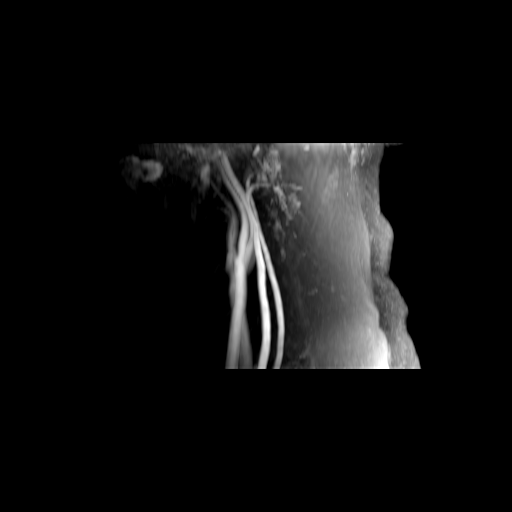

[28 of 48 positions shown; findings below may reference images not displayed]

FINDINGS: MRI HEAD FINDINGS

Brain: No acute infarction, hemorrhage, hydrocephalus, extra-axial
collection or mass lesion. Normal for age cerebral volume. Premature
for age T2 and FLAIR hyperintensities in the white matter, likely
small vessel disease.

Vascular: Normal flow voids.

Skull and upper cervical spine: Normal marrow signal. Incompletely
evaluated is cervical spondylosis at C3-4, central disc osteophyte
complex resulting in mild stenosis.

Sinuses/Orbits: Negative.

Other: None.

Compared with prior CT, similar appearance.

MRA HEAD FINDINGS

The internal carotid arteries are widely patent. The basilar artery
is widely patent. Vertebrals codominant. Hypoplastic but patent
RIGHT A1 ACA, with dominant LEFT ACA. No M1 or M2 stenosis. No
distal ACA or MCA irregularity.

Posterior cerebral arteries widely patent. No cerebellar branch
occlusion. No saccular aneurysm.

MRA NECK FINDINGS

Conventional branching of the great vessels from the arch. No
proximal stenosis.

Carotid bifurcations free of disease. No dissection or fibromuscular
dysplasia.

Vertebral arteries codominant. No ostial stenosis or narrowing in
the neck.
IMPRESSION: Premature for age white matter changes, likely small vessel disease.

No acute intracranial findings.

No intracranial or extracranial stenosis. Craniocerebral vasculature
appears widely patent.

## 2018-04-30 IMAGING — DX DG LUMBAR SPINE COMPLETE 4+V
6 series · 6 of 6 positions shown · non-contrast
Comparison: 02/07/2017

CLINICAL DATA: Syncope.  Fall.

EXAM:
LUMBAR SPINE - COMPLETE 4+ VIEW

[t lumbar spine ap (1 of 2)]
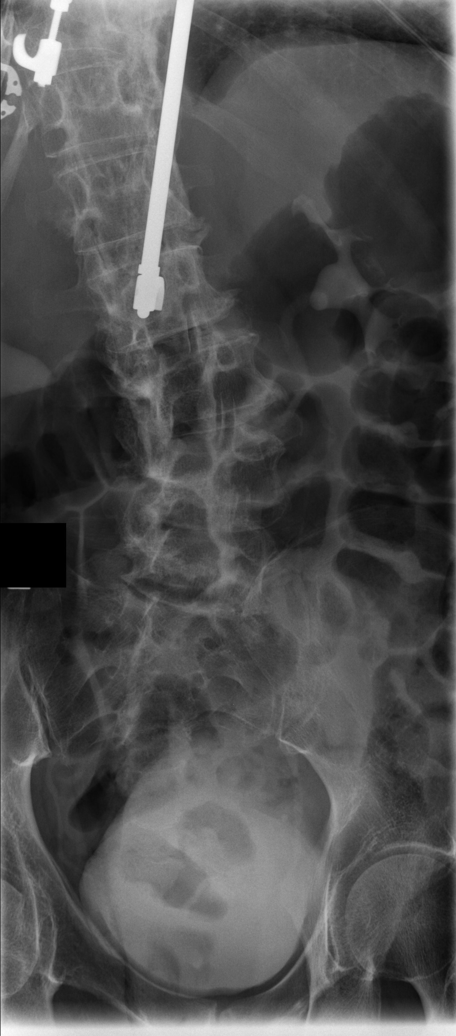

[t lumbar spine ap (2 of 2)]
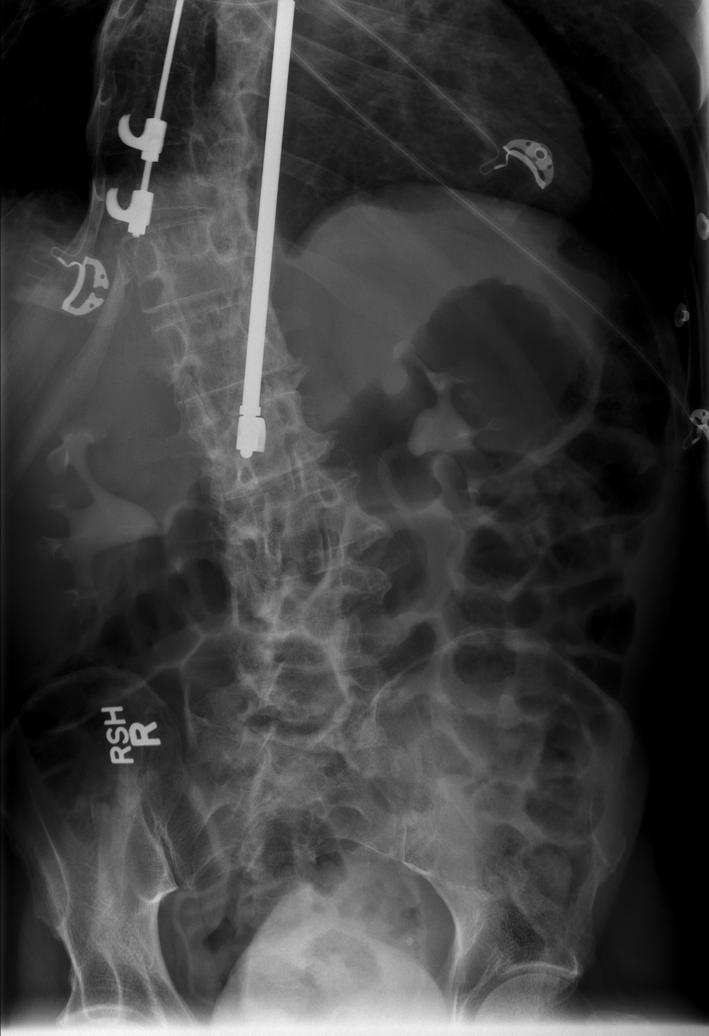

[t lumbar spine obl (1 of 2)]
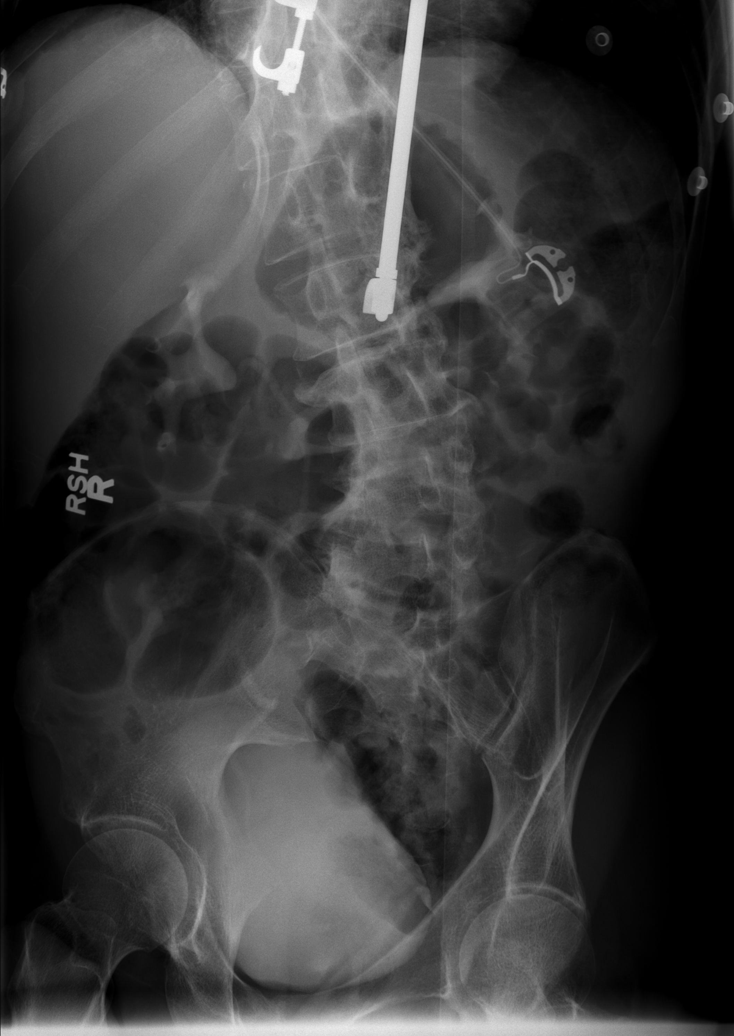

[t lumbar spine obl (2 of 2)]
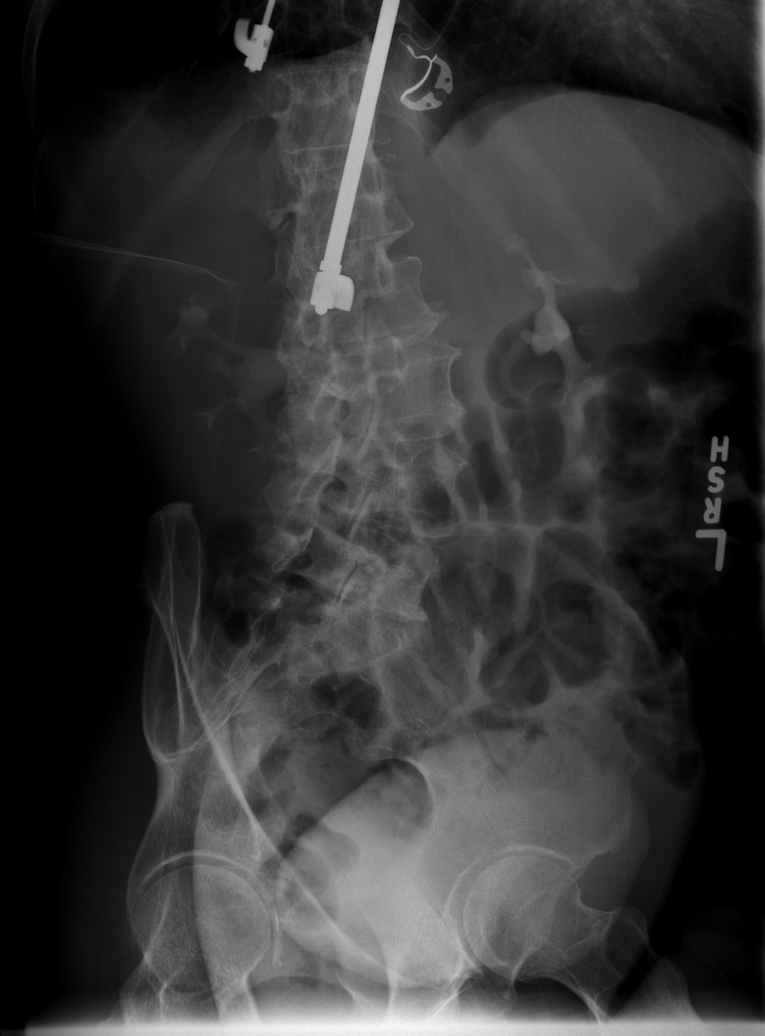

[t lumbar spine lat]
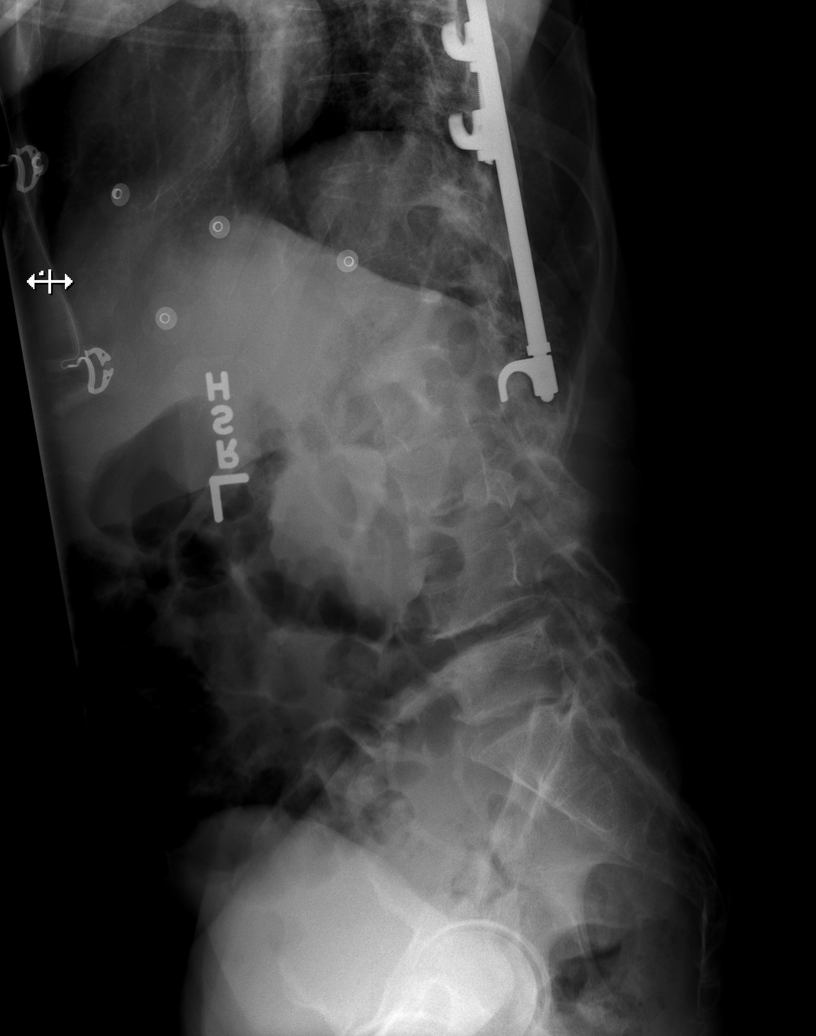

[t lumbar l-5 s-1 spot]
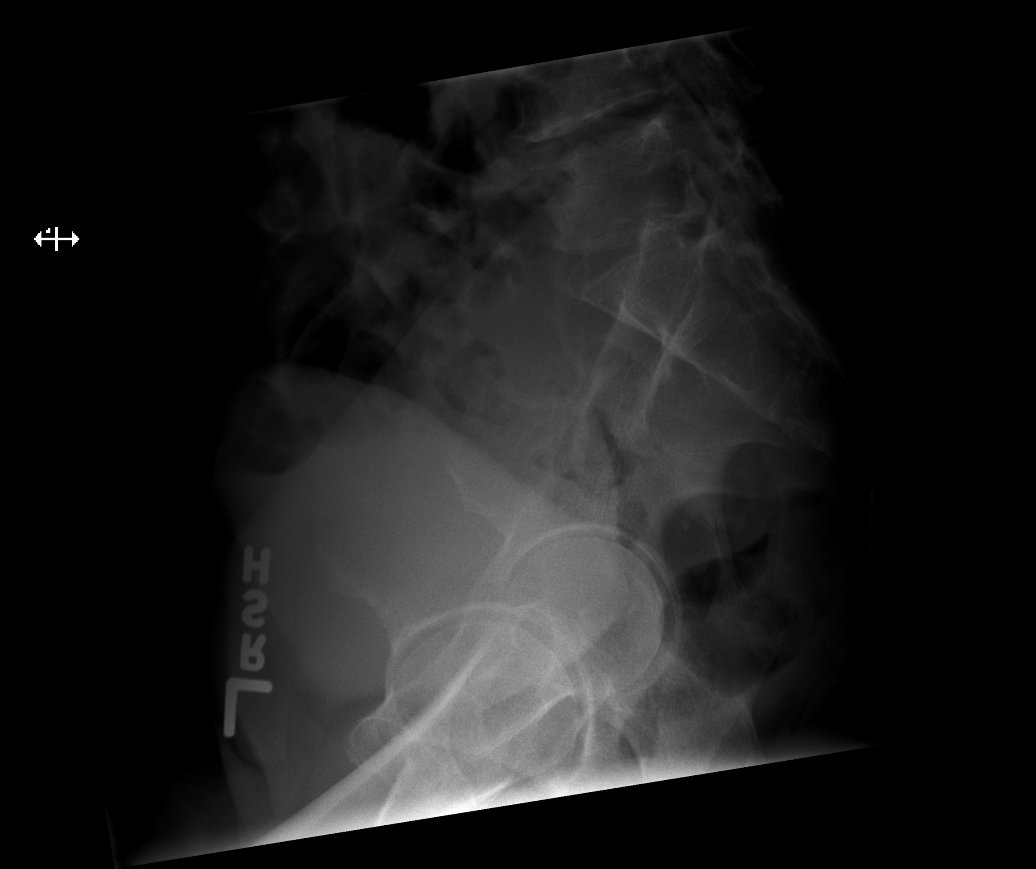

[6 of 6 positions shown; findings below may reference images not displayed]

FINDINGS: A metal Harrington rod extends from the thoracic spine to the lumbar
spine at the level of the L2 lamina. There is stable dextroscoliosis
of the lower thoracic spine. An additional Harrington rod extends
from the upper thorax to the lower thoracic spine. No vertebral
compression deformity. There is vacuum discs noted at the L4-5
level. No evidence of breakage of the hardware.
IMPRESSION: Stable scoliosis and postoperative changes.

## 2018-07-29 ENCOUNTER — Emergency Department (HOSPITAL_COMMUNITY): Payer: Medicaid Other

## 2018-07-29 ENCOUNTER — Encounter (HOSPITAL_COMMUNITY): Payer: Self-pay | Admitting: Emergency Medicine

## 2018-07-29 ENCOUNTER — Emergency Department (HOSPITAL_COMMUNITY)
Admission: EM | Admit: 2018-07-29 | Discharge: 2018-07-29 | Disposition: A | Discharge status: Home / Self Care | Payer: Medicaid Other | Attending: Emergency Medicine | Admitting: Emergency Medicine

## 2018-07-29 DIAGNOSIS — F17210 Nicotine dependence, cigarettes, uncomplicated: Secondary | ICD-10-CM | POA: Diagnosis not present

## 2018-07-29 DIAGNOSIS — J069 Acute upper respiratory infection, unspecified: Secondary | ICD-10-CM | POA: Insufficient documentation

## 2018-07-29 DIAGNOSIS — E1165 Type 2 diabetes mellitus with hyperglycemia: Secondary | ICD-10-CM | POA: Diagnosis not present

## 2018-07-29 DIAGNOSIS — Z7984 Long term (current) use of oral hypoglycemic drugs: Secondary | ICD-10-CM | POA: Diagnosis not present

## 2018-07-29 DIAGNOSIS — J988 Other specified respiratory disorders: Secondary | ICD-10-CM

## 2018-07-29 DIAGNOSIS — R739 Hyperglycemia, unspecified: Secondary | ICD-10-CM

## 2018-07-29 DIAGNOSIS — R05 Cough: Secondary | ICD-10-CM | POA: Diagnosis present

## 2018-07-29 LAB — CBC WITH DIFFERENTIAL/PLATELET
Abs Immature Granulocytes: 0.02 10*3/uL (ref 0.00–0.07)
BASOS ABS: 0 10*3/uL (ref 0.0–0.1)
Basophils Relative: 0 %
Eosinophils Absolute: 0.3 10*3/uL (ref 0.0–0.5)
Eosinophils Relative: 4 %
HEMATOCRIT: 41.7 % (ref 39.0–52.0)
HEMOGLOBIN: 13.2 g/dL (ref 13.0–17.0)
IMMATURE GRANULOCYTES: 0 %
LYMPHS ABS: 1.9 10*3/uL (ref 0.7–4.0)
LYMPHS PCT: 31 %
MCH: 27.6 pg (ref 26.0–34.0)
MCHC: 31.7 g/dL (ref 30.0–36.0)
MCV: 87.1 fL (ref 80.0–100.0)
Monocytes Absolute: 0.7 10*3/uL (ref 0.1–1.0)
Monocytes Relative: 12 %
NEUTROS PCT: 53 %
NRBC: 0 % (ref 0.0–0.2)
Neutro Abs: 3.2 10*3/uL (ref 1.7–7.7)
Platelets: 308 10*3/uL (ref 150–400)
RBC: 4.79 MIL/uL (ref 4.22–5.81)
RDW: 13.2 % (ref 11.5–15.5)
WBC: 6.1 10*3/uL (ref 4.0–10.5)

## 2018-07-29 LAB — COMPREHENSIVE METABOLIC PANEL
ALBUMIN: 3.2 g/dL — AB (ref 3.5–5.0)
ALT: 16 U/L (ref 0–44)
ANION GAP: 10 (ref 5–15)
AST: 20 U/L (ref 15–41)
Alkaline Phosphatase: 61 U/L (ref 38–126)
BUN: 10 mg/dL (ref 6–20)
CHLORIDE: 105 mmol/L (ref 98–111)
CO2: 23 mmol/L (ref 22–32)
Calcium: 9.3 mg/dL (ref 8.9–10.3)
Creatinine, Ser: 1.1 mg/dL (ref 0.61–1.24)
GFR calc non Af Amer: >60 mL/min (ref >60)
GLUCOSE: 250 mg/dL — AB (ref 70–99)
Potassium: 3.8 mmol/L (ref 3.5–5.1)
SODIUM: 138 mmol/L (ref 135–145)
Total Bilirubin: 0.4 mg/dL (ref 0.3–1.2)
Total Protein: 6.7 g/dL (ref 6.5–8.1)

## 2018-07-29 MED ORDER — GLIPIZIDE 10 MG PO TABS: 10.0000 mg | ORAL_TABLET | Freq: Every day | ORAL | 0 refills | Status: DC

## 2018-07-29 MED ORDER — IPRATROPIUM-ALBUTEROL 0.5-2.5 (3) MG/3ML IN SOLN
3.0000 mL | Freq: Once | RESPIRATORY_TRACT | Status: AC
  Administered 2018-07-29: 3 mL via RESPIRATORY_TRACT
  Filled 2018-07-29: qty 3

## 2018-07-29 MED ORDER — AZITHROMYCIN 100 MG/5ML PO SUSR: 100.0000 mg | Freq: Every day | ORAL | 0 refills | Status: AC

## 2018-07-29 MED ORDER — ALBUTEROL SULFATE HFA 108 (90 BASE) MCG/ACT IN AERS
1.0000 | INHALATION_SPRAY | Freq: Once | RESPIRATORY_TRACT | Status: AC
  Administered 2018-07-29: 2 via RESPIRATORY_TRACT
  Filled 2018-07-29: qty 6.7

## 2018-07-29 NOTE — ED Triage Notes (Signed)
Pt complains of cough with green/white sputum for a week. Denies any other symptoms.

## 2018-07-29 NOTE — Discharge Instructions (Addendum)
Please read attached information. If you experience any new or worsening signs or symptoms please return to the emergency room for evaluation. Please follow-up with your primary care provider or specialist as discussed. Please use medication prescribed only as directed and discontinue taking if you have any concerning signs or symptoms.   °

## 2018-07-29 NOTE — ED Provider Notes (Signed)
[facility] EMERGENCY DEPARTMENT Provider Note   CSN: [id number] Arrival date & time: 07/29/18  1218   History   Chief Complaint Chief Complaint  Patient presents with  . Cough    possible pna    HPI Allen Hernandez is a 57 y.o. male.  HPI   58 year old male presents today with upper respiratory infection.  Patient notes he has had a productive cough for the last 1.5 weeks.  He notes that this has turned to a clear cough.  He notes some associated shortness of breath, notes fever last week, none today.  He notes he is a smoker.  Denies any history of asthma or COPD.  He notes his primary care provider was fired and has not been able to get his medications including diabetic medication.  He notes he has his follow-up appointment in 2 weeks with another primary care provider.    Past Medical History:  Diagnosis Date  . Diabetes (HCC)    recently diagnosed - 3 months ago  . Neuropathy due to secondary diabetes (HCC) 08/2014  . Pneumonia   . Renal disorder   . Respiratory failure [facility])     Patient Active Problem List   Diagnosis Date Noted  . Chest pain 02/07/2018  . Chronic myofascial pain 02/19/2015  . 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 Type 2 diabetes mellitus without complication (HCC) 07/13/2014  . Scoliosis (and kyphoscoliosis), idiopathic 11/04/2013  . Low back pain 11/04/2013    Past Surgical History:  Procedure Laterality Date  . SPINE SURGERY          Home Medications    Prior to Admission medications   Medication Sig Start Date End Date Taking? Authorizing Provider  azithromycin (ZITHROMAX) 100 MG/5ML suspension Take 5 mLs (100 mg total) by mouth daily for 5 days. Please take 25 mL's on day 1 followed by, 12.5 mL's for the following 4 days 07/29/18 08/03/18  Melyna Huron, Tinnie Gens, PA-C  famotidine (PEPCID) 20 MG tablet Take 1 tablet (20 mg total) by mouth 2 (two) times daily. Patient not taking: Reported on 02/07/2018 04/04/17   Renne Crigler, PA-C  gabapentin (NEURONTIN) 300 MG capsule Take 1 capsule (300 mg total) by mouth 3 (three) times daily. Patient not taking: Reported on 02/07/2018 01/04/15   Quentin Angst, MD  glipiZIDE (GLUCOTROL) 10 MG tablet Take 1 tablet (10 mg total) by mouth daily before breakfast. 07/29/18   Jeiden Daughtridge, Tinnie Gens, PA-C  metoCLOPramide (REGLAN) 5 MG tablet Take 1 tablet (5 mg total) by mouth 3 (three) times daily before meals. 02/08/18   Arnetha Courser, MD  omeprazole (PRILOSEC) 20 MG capsule Take 20 mg by mouth daily.    [provider]  oxyCODONE (ROXICODONE) 15 MG immediate release tablet TK 1 T PO TID 04/03/17   [provider]  pioglitazone (ACTOS) 15 MG tablet Take 1 tablet (15 mg total) by mouth daily. 02/08/18 02/08/19  Arnetha Courser, MD    Family History Family History  Problem Relation Age of Onset  . Cancer Mother   . CAD Father   . Diabetes Brother     Social History Social History   Tobacco Use  . Smoking status: Current Every Day Smoker    Packs/day: 0.25    Types: Cigarettes  . Smokeless tobacco: Never Used  Substance Use Topics  . Alcohol use: Yes    Alcohol/week: 4.0 standard drinks    Types: 4 Cans of beer per week    Comment: occasionally  .  Drug use: Yes    Types: Cocaine     Allergies   Patient has no known allergies.   Review of Systems Review of Systems  All other systems reviewed and are negative.    Physical Exam Updated Vital Signs BP 140/82 (BP Location: Right Arm)   Pulse 88   Temp 97.7 F (36.5 C) (Oral)   Resp 20   Ht 5\' 9"  (1.753 m)   Wt 62.1 kg   SpO2 99%   BMI 20.23 kg/m   Physical Exam  Constitutional: He is oriented to person, place, and time. He appears well-developed and well-nourished.  HENT:  Head: Normocephalic and atraumatic.  Eyes: Pupils are equal, round, and reactive to light. Conjunctivae are normal. Right eye exhibits no discharge. Left eye exhibits no discharge. No scleral icterus.  Neck: Normal  range of motion. No JVD present. No tracheal deviation present.  Pulmonary/Chest: Effort normal. No stridor. No respiratory distress. He has wheezes. He has no rales. He exhibits no tenderness.  Minor expiratory wheeze in the lower lobes  Neurological: He is alert and oriented to person, place, and time. Coordination normal.  Psychiatric: He has a normal mood and affect. His behavior is normal. Judgment and thought content normal.  Nursing note and vitals reviewed.    ED Treatments / Results  Labs (all labs ordered are listed, but only abnormal results are displayed) Labs Reviewed  COMPREHENSIVE METABOLIC PANEL - Abnormal; Notable for the following components:      Result Value   Glucose, Bld 250 (*)    Albumin 3.2 (*)    All other components within normal limits  CBC WITH DIFFERENTIAL/PLATELET  URINALYSIS, ROUTINE W REFLEX MICROSCOPIC    EKG None  Radiology Dg Chest 2 View  Result Date: 07/29/2018 CLINICAL DATA:  Cough. EXAM: CHEST - 2 VIEW COMPARISON:  02/07/2018 FINDINGS: Chronic thoracolumbar scoliosis. Harrington rods in place. Heart size and vascularity are normal. Lungs are clear. IMPRESSION: No acute abnormalities. Electronically Signed   By: Francene Boyers M.D.   On: 07/29/2018 13:55    Procedures Procedures (including critical care time)  Medications Ordered in ED Medications  ipratropium-albuterol (DUONEB) 0.5-2.5 (3) MG/3ML nebulizer solution 3 mL (3 mLs Nebulization Given 07/29/18 1628)  albuterol (PROVENTIL HFA;VENTOLIN HFA) 108 (90 Base) MCG/ACT inhaler 1-2 puff (2 puffs Inhalation Given 07/29/18 1815)     Initial Impression / Assessment and Plan / ED Course  I have reviewed the triage vital signs and the nursing notes.  Pertinent labs & imaging results that were available during my care of the patient were reviewed by me and considered in my medical decision making (see chart for details).     Labs: CMP, CBC  Imaging: Chest 2  view  Consults:  Therapeutics: Albuterol, ipratropium  Discharge Meds: Glipizide, azithromycin  Assessment/Plan: 57 year old male presents today with likely upper respiratory infection.  Patient is afebrile with reassuring oxygen saturation.  He has no elevation in white count.  Patient has had cough for 1.5 weeks, given his significant past medical history discussion of antibiotics versus watch and wait was had with the patient.  Patient will be to prescription for azithromycin if his symptoms do not improve or start to worsen he will initiate antibiotic therapy.  He will use albuterol as needed, he will follow-up as an outpatient with his primary care provider is given strict return precautions, he verbalized understanding and agreement to today's plan and had no further questions concerns the time discharge.  Final Clinical Impressions(s) / ED Diagnoses   Final diagnoses:  Respiratory infection  Hyperglycemia    ED Discharge Orders         Ordered    glipiZIDE (GLUCOTROL) 10 MG tablet  Daily before breakfast     07/29/18 1759    azithromycin (ZITHROMAX) 100 MG/5ML suspension  Daily     07/29/18 1803           Eyvonne Mechanic, PA-C 07/29/18 1941    Azalia Bilis, MD 07/29/18 [phone]

## 2019-12-01 DIAGNOSIS — F17210 Nicotine dependence, cigarettes, uncomplicated: Secondary | ICD-10-CM | POA: Diagnosis not present

## 2019-12-01 DIAGNOSIS — R5383 Other fatigue: Secondary | ICD-10-CM | POA: Diagnosis not present

## 2019-12-01 DIAGNOSIS — Z0000 Encounter for general adult medical examination without abnormal findings: Secondary | ICD-10-CM | POA: Diagnosis not present

## 2019-12-01 DIAGNOSIS — G629 Polyneuropathy, unspecified: Secondary | ICD-10-CM | POA: Diagnosis not present

## 2019-12-01 DIAGNOSIS — M545 Low back pain: Secondary | ICD-10-CM | POA: Diagnosis not present

## 2019-12-01 DIAGNOSIS — E559 Vitamin D deficiency, unspecified: Secondary | ICD-10-CM | POA: Diagnosis not present

## 2019-12-01 DIAGNOSIS — Z79899 Other long term (current) drug therapy: Secondary | ICD-10-CM | POA: Diagnosis not present

## 2019-12-01 DIAGNOSIS — E1165 Type 2 diabetes mellitus with hyperglycemia: Secondary | ICD-10-CM | POA: Diagnosis not present

## 2019-12-01 DIAGNOSIS — Z1159 Encounter for screening for other viral diseases: Secondary | ICD-10-CM | POA: Diagnosis not present

## 2019-12-01 DIAGNOSIS — Z79891 Long term (current) use of opiate analgesic: Secondary | ICD-10-CM | POA: Diagnosis not present

## 2019-12-10 DIAGNOSIS — E1165 Type 2 diabetes mellitus with hyperglycemia: Secondary | ICD-10-CM | POA: Diagnosis not present

## 2019-12-10 DIAGNOSIS — M545 Low back pain: Secondary | ICD-10-CM | POA: Diagnosis not present

## 2019-12-10 DIAGNOSIS — Z79891 Long term (current) use of opiate analgesic: Secondary | ICD-10-CM | POA: Diagnosis not present

## 2019-12-10 DIAGNOSIS — Z79899 Other long term (current) drug therapy: Secondary | ICD-10-CM | POA: Diagnosis not present

## 2019-12-26 DIAGNOSIS — Z79891 Long term (current) use of opiate analgesic: Secondary | ICD-10-CM | POA: Diagnosis not present

## 2019-12-26 DIAGNOSIS — M545 Low back pain: Secondary | ICD-10-CM | POA: Diagnosis not present

## 2019-12-26 DIAGNOSIS — Z79899 Other long term (current) drug therapy: Secondary | ICD-10-CM | POA: Diagnosis not present

## 2019-12-26 DIAGNOSIS — E119 Type 2 diabetes mellitus without complications: Secondary | ICD-10-CM | POA: Diagnosis not present

## 2020-01-30 DIAGNOSIS — M545 Low back pain: Secondary | ICD-10-CM | POA: Diagnosis not present

## 2020-01-30 DIAGNOSIS — M5137 Other intervertebral disc degeneration, lumbosacral region: Secondary | ICD-10-CM | POA: Diagnosis not present

## 2020-01-30 DIAGNOSIS — Z79899 Other long term (current) drug therapy: Secondary | ICD-10-CM | POA: Diagnosis not present

## 2020-01-30 DIAGNOSIS — Z79891 Long term (current) use of opiate analgesic: Secondary | ICD-10-CM | POA: Diagnosis not present

## 2020-03-01 DIAGNOSIS — F17210 Nicotine dependence, cigarettes, uncomplicated: Secondary | ICD-10-CM | POA: Diagnosis not present

## 2020-03-01 DIAGNOSIS — M545 Low back pain: Secondary | ICD-10-CM | POA: Diagnosis not present

## 2020-03-01 DIAGNOSIS — Z79899 Other long term (current) drug therapy: Secondary | ICD-10-CM | POA: Diagnosis not present

## 2020-03-01 DIAGNOSIS — Z79891 Long term (current) use of opiate analgesic: Secondary | ICD-10-CM | POA: Diagnosis not present

## 2020-03-17 DIAGNOSIS — Z79899 Other long term (current) drug therapy: Secondary | ICD-10-CM | POA: Diagnosis not present

## 2020-03-17 DIAGNOSIS — F17210 Nicotine dependence, cigarettes, uncomplicated: Secondary | ICD-10-CM | POA: Diagnosis not present

## 2020-03-17 DIAGNOSIS — M545 Low back pain: Secondary | ICD-10-CM | POA: Diagnosis not present

## 2020-04-14 DIAGNOSIS — J209 Acute bronchitis, unspecified: Secondary | ICD-10-CM | POA: Diagnosis not present

## 2020-04-14 DIAGNOSIS — Z79899 Other long term (current) drug therapy: Secondary | ICD-10-CM | POA: Diagnosis not present

## 2020-04-14 DIAGNOSIS — M545 Low back pain: Secondary | ICD-10-CM | POA: Diagnosis not present

## 2020-04-14 DIAGNOSIS — F17210 Nicotine dependence, cigarettes, uncomplicated: Secondary | ICD-10-CM | POA: Diagnosis not present

## 2020-05-18 DIAGNOSIS — Z79899 Other long term (current) drug therapy: Secondary | ICD-10-CM | POA: Diagnosis not present

## 2020-05-18 DIAGNOSIS — F17210 Nicotine dependence, cigarettes, uncomplicated: Secondary | ICD-10-CM | POA: Diagnosis not present

## 2020-05-18 DIAGNOSIS — M545 Low back pain: Secondary | ICD-10-CM | POA: Diagnosis not present

## 2020-05-18 DIAGNOSIS — E119 Type 2 diabetes mellitus without complications: Secondary | ICD-10-CM | POA: Diagnosis not present

## 2020-05-18 DIAGNOSIS — Z23 Encounter for immunization: Secondary | ICD-10-CM | POA: Diagnosis not present

## 2020-07-30 DIAGNOSIS — M546 Pain in thoracic spine: Secondary | ICD-10-CM | POA: Diagnosis not present

## 2020-07-30 DIAGNOSIS — G8929 Other chronic pain: Secondary | ICD-10-CM | POA: Diagnosis not present

## 2020-07-30 DIAGNOSIS — E1142 Type 2 diabetes mellitus with diabetic polyneuropathy: Secondary | ICD-10-CM | POA: Diagnosis not present

## 2020-10-26 DIAGNOSIS — Z79899 Other long term (current) drug therapy: Secondary | ICD-10-CM | POA: Diagnosis not present

## 2020-10-26 DIAGNOSIS — M419 Scoliosis, unspecified: Secondary | ICD-10-CM | POA: Diagnosis not present

## 2020-10-26 DIAGNOSIS — G8929 Other chronic pain: Secondary | ICD-10-CM | POA: Diagnosis not present

## 2020-10-26 DIAGNOSIS — F17210 Nicotine dependence, cigarettes, uncomplicated: Secondary | ICD-10-CM | POA: Diagnosis not present

## 2020-10-26 DIAGNOSIS — Z6820 Body mass index (BMI) 20.0-20.9, adult: Secondary | ICD-10-CM | POA: Diagnosis not present

## 2020-10-26 DIAGNOSIS — M546 Pain in thoracic spine: Secondary | ICD-10-CM | POA: Diagnosis not present

## 2020-10-26 DIAGNOSIS — M545 Low back pain, unspecified: Secondary | ICD-10-CM | POA: Diagnosis not present

## 2020-11-09 DIAGNOSIS — M546 Pain in thoracic spine: Secondary | ICD-10-CM | POA: Diagnosis not present

## 2020-11-09 DIAGNOSIS — F17210 Nicotine dependence, cigarettes, uncomplicated: Secondary | ICD-10-CM | POA: Diagnosis not present

## 2020-11-09 DIAGNOSIS — Z6820 Body mass index (BMI) 20.0-20.9, adult: Secondary | ICD-10-CM | POA: Diagnosis not present

## 2020-11-09 DIAGNOSIS — F419 Anxiety disorder, unspecified: Secondary | ICD-10-CM | POA: Diagnosis not present

## 2020-11-09 DIAGNOSIS — F32A Depression, unspecified: Secondary | ICD-10-CM | POA: Diagnosis not present

## 2020-11-09 DIAGNOSIS — M419 Scoliosis, unspecified: Secondary | ICD-10-CM | POA: Diagnosis not present

## 2020-11-09 DIAGNOSIS — Z79899 Other long term (current) drug therapy: Secondary | ICD-10-CM | POA: Diagnosis not present

## 2020-11-25 DIAGNOSIS — Z79899 Other long term (current) drug therapy: Secondary | ICD-10-CM | POA: Diagnosis not present

## 2020-11-25 DIAGNOSIS — M546 Pain in thoracic spine: Secondary | ICD-10-CM | POA: Diagnosis not present

## 2020-11-25 DIAGNOSIS — M545 Low back pain, unspecified: Secondary | ICD-10-CM | POA: Diagnosis not present

## 2020-11-25 DIAGNOSIS — K219 Gastro-esophageal reflux disease without esophagitis: Secondary | ICD-10-CM | POA: Diagnosis not present

## 2020-11-25 DIAGNOSIS — M419 Scoliosis, unspecified: Secondary | ICD-10-CM | POA: Diagnosis not present

## 2020-11-25 DIAGNOSIS — F17210 Nicotine dependence, cigarettes, uncomplicated: Secondary | ICD-10-CM | POA: Diagnosis not present

## 2020-11-25 DIAGNOSIS — Z6820 Body mass index (BMI) 20.0-20.9, adult: Secondary | ICD-10-CM | POA: Diagnosis not present

## 2020-12-22 DIAGNOSIS — M545 Low back pain, unspecified: Secondary | ICD-10-CM | POA: Diagnosis not present

## 2020-12-22 DIAGNOSIS — F17210 Nicotine dependence, cigarettes, uncomplicated: Secondary | ICD-10-CM | POA: Diagnosis not present

## 2020-12-22 DIAGNOSIS — M546 Pain in thoracic spine: Secondary | ICD-10-CM | POA: Diagnosis not present

## 2020-12-22 DIAGNOSIS — K219 Gastro-esophageal reflux disease without esophagitis: Secondary | ICD-10-CM | POA: Diagnosis not present

## 2020-12-22 DIAGNOSIS — Z79899 Other long term (current) drug therapy: Secondary | ICD-10-CM | POA: Diagnosis not present

## 2020-12-22 DIAGNOSIS — M419 Scoliosis, unspecified: Secondary | ICD-10-CM | POA: Diagnosis not present

## 2021-01-18 DIAGNOSIS — Z6820 Body mass index (BMI) 20.0-20.9, adult: Secondary | ICD-10-CM | POA: Diagnosis not present

## 2021-01-18 DIAGNOSIS — R5383 Other fatigue: Secondary | ICD-10-CM | POA: Diagnosis not present

## 2021-01-18 DIAGNOSIS — E559 Vitamin D deficiency, unspecified: Secondary | ICD-10-CM | POA: Diagnosis not present

## 2021-01-18 DIAGNOSIS — G8929 Other chronic pain: Secondary | ICD-10-CM | POA: Diagnosis not present

## 2021-01-18 DIAGNOSIS — E7800 Pure hypercholesterolemia, unspecified: Secondary | ICD-10-CM | POA: Diagnosis not present

## 2021-01-18 DIAGNOSIS — Z79899 Other long term (current) drug therapy: Secondary | ICD-10-CM | POA: Diagnosis not present

## 2021-01-18 DIAGNOSIS — E1165 Type 2 diabetes mellitus with hyperglycemia: Secondary | ICD-10-CM | POA: Diagnosis not present

## 2021-01-18 DIAGNOSIS — M129 Arthropathy, unspecified: Secondary | ICD-10-CM | POA: Diagnosis not present

## 2021-01-18 DIAGNOSIS — M545 Low back pain, unspecified: Secondary | ICD-10-CM | POA: Diagnosis not present

## 2021-01-18 DIAGNOSIS — M546 Pain in thoracic spine: Secondary | ICD-10-CM | POA: Diagnosis not present

## 2021-01-18 DIAGNOSIS — F17210 Nicotine dependence, cigarettes, uncomplicated: Secondary | ICD-10-CM | POA: Diagnosis not present

## 2021-01-18 DIAGNOSIS — M419 Scoliosis, unspecified: Secondary | ICD-10-CM | POA: Diagnosis not present

## 2021-02-15 DIAGNOSIS — Z9114 Patient's other noncompliance with medication regimen: Secondary | ICD-10-CM | POA: Diagnosis not present

## 2021-02-15 DIAGNOSIS — M419 Scoliosis, unspecified: Secondary | ICD-10-CM | POA: Diagnosis not present

## 2021-02-15 DIAGNOSIS — Z79899 Other long term (current) drug therapy: Secondary | ICD-10-CM | POA: Diagnosis not present

## 2021-03-04 DIAGNOSIS — Z79899 Other long term (current) drug therapy: Secondary | ICD-10-CM | POA: Diagnosis not present

## 2021-03-04 DIAGNOSIS — M419 Scoliosis, unspecified: Secondary | ICD-10-CM | POA: Diagnosis not present

## 2021-03-04 DIAGNOSIS — Z760 Encounter for issue of repeat prescription: Secondary | ICD-10-CM | POA: Diagnosis not present

## 2021-03-04 DIAGNOSIS — Z681 Body mass index (BMI) 19 or less, adult: Secondary | ICD-10-CM | POA: Diagnosis not present

## 2021-03-04 DIAGNOSIS — M545 Low back pain, unspecified: Secondary | ICD-10-CM | POA: Diagnosis not present

## 2021-03-04 DIAGNOSIS — M546 Pain in thoracic spine: Secondary | ICD-10-CM | POA: Diagnosis not present

## 2021-03-04 DIAGNOSIS — F17210 Nicotine dependence, cigarettes, uncomplicated: Secondary | ICD-10-CM | POA: Diagnosis not present

## 2021-03-08 DIAGNOSIS — Z79899 Other long term (current) drug therapy: Secondary | ICD-10-CM | POA: Diagnosis not present

## 2021-04-12 DIAGNOSIS — G8929 Other chronic pain: Secondary | ICD-10-CM | POA: Diagnosis not present

## 2021-04-12 DIAGNOSIS — M545 Low back pain, unspecified: Secondary | ICD-10-CM | POA: Diagnosis not present

## 2021-04-12 DIAGNOSIS — Z79899 Other long term (current) drug therapy: Secondary | ICD-10-CM | POA: Diagnosis not present

## 2021-04-12 DIAGNOSIS — Z681 Body mass index (BMI) 19 or less, adult: Secondary | ICD-10-CM | POA: Diagnosis not present

## 2021-04-12 DIAGNOSIS — M419 Scoliosis, unspecified: Secondary | ICD-10-CM | POA: Diagnosis not present

## 2021-04-12 DIAGNOSIS — F17210 Nicotine dependence, cigarettes, uncomplicated: Secondary | ICD-10-CM | POA: Diagnosis not present

## 2021-04-12 DIAGNOSIS — M546 Pain in thoracic spine: Secondary | ICD-10-CM | POA: Diagnosis not present

## 2021-04-15 DIAGNOSIS — Z79899 Other long term (current) drug therapy: Secondary | ICD-10-CM | POA: Diagnosis not present

## 2021-04-26 DIAGNOSIS — F419 Anxiety disorder, unspecified: Secondary | ICD-10-CM | POA: Diagnosis not present

## 2021-04-26 DIAGNOSIS — Z79899 Other long term (current) drug therapy: Secondary | ICD-10-CM | POA: Diagnosis not present

## 2021-04-26 DIAGNOSIS — F32A Depression, unspecified: Secondary | ICD-10-CM | POA: Diagnosis not present

## 2021-04-26 DIAGNOSIS — Z681 Body mass index (BMI) 19 or less, adult: Secondary | ICD-10-CM | POA: Diagnosis not present

## 2021-04-26 DIAGNOSIS — K219 Gastro-esophageal reflux disease without esophagitis: Secondary | ICD-10-CM | POA: Diagnosis not present

## 2021-04-26 DIAGNOSIS — Z9114 Patient's other noncompliance with medication regimen: Secondary | ICD-10-CM | POA: Diagnosis not present

## 2021-04-26 DIAGNOSIS — F17210 Nicotine dependence, cigarettes, uncomplicated: Secondary | ICD-10-CM | POA: Diagnosis not present

## 2021-06-07 DIAGNOSIS — Z79899 Other long term (current) drug therapy: Secondary | ICD-10-CM | POA: Diagnosis not present

## 2021-06-07 DIAGNOSIS — Z9114 Patient's other noncompliance with medication regimen: Secondary | ICD-10-CM | POA: Diagnosis not present

## 2021-06-07 DIAGNOSIS — M419 Scoliosis, unspecified: Secondary | ICD-10-CM | POA: Diagnosis not present

## 2021-06-09 DIAGNOSIS — Z79899 Other long term (current) drug therapy: Secondary | ICD-10-CM | POA: Diagnosis not present

## 2021-07-04 ENCOUNTER — Other Ambulatory Visit: Payer: Self-pay

## 2021-07-04 ENCOUNTER — Emergency Department (HOSPITAL_COMMUNITY): Payer: Medicaid Other

## 2021-07-04 ENCOUNTER — Encounter (HOSPITAL_COMMUNITY): Payer: Self-pay

## 2021-07-04 ENCOUNTER — Emergency Department (HOSPITAL_COMMUNITY)
Admission: EM | Admit: 2021-07-04 | Discharge: 2021-07-04 | Disposition: A | Discharge status: Home / Self Care | Payer: Medicaid Other | Attending: Emergency Medicine | Admitting: Emergency Medicine

## 2021-07-04 DIAGNOSIS — F17210 Nicotine dependence, cigarettes, uncomplicated: Secondary | ICD-10-CM | POA: Insufficient documentation

## 2021-07-04 DIAGNOSIS — Z20822 Contact with and (suspected) exposure to covid-19: Secondary | ICD-10-CM | POA: Diagnosis not present

## 2021-07-04 DIAGNOSIS — R069 Unspecified abnormalities of breathing: Secondary | ICD-10-CM | POA: Diagnosis not present

## 2021-07-04 DIAGNOSIS — J9811 Atelectasis: Secondary | ICD-10-CM | POA: Diagnosis not present

## 2021-07-04 DIAGNOSIS — Z7984 Long term (current) use of oral hypoglycemic drugs: Secondary | ICD-10-CM | POA: Insufficient documentation

## 2021-07-04 DIAGNOSIS — Z743 Need for continuous supervision: Secondary | ICD-10-CM | POA: Diagnosis not present

## 2021-07-04 DIAGNOSIS — E119 Type 2 diabetes mellitus without complications: Secondary | ICD-10-CM | POA: Insufficient documentation

## 2021-07-04 DIAGNOSIS — M545 Low back pain, unspecified: Secondary | ICD-10-CM | POA: Diagnosis not present

## 2021-07-04 DIAGNOSIS — M549 Dorsalgia, unspecified: Secondary | ICD-10-CM | POA: Diagnosis not present

## 2021-07-04 DIAGNOSIS — K5900 Constipation, unspecified: Secondary | ICD-10-CM | POA: Diagnosis not present

## 2021-07-04 DIAGNOSIS — R5381 Other malaise: Secondary | ICD-10-CM | POA: Diagnosis not present

## 2021-07-04 DIAGNOSIS — R0602 Shortness of breath: Secondary | ICD-10-CM | POA: Insufficient documentation

## 2021-07-04 DIAGNOSIS — J439 Emphysema, unspecified: Secondary | ICD-10-CM | POA: Diagnosis not present

## 2021-07-04 DIAGNOSIS — R1084 Generalized abdominal pain: Secondary | ICD-10-CM | POA: Diagnosis not present

## 2021-07-04 DIAGNOSIS — R0902 Hypoxemia: Secondary | ICD-10-CM | POA: Diagnosis not present

## 2021-07-04 LAB — TROPONIN I (HIGH SENSITIVITY): Troponin I (High Sensitivity): 5 ng/L (ref <18)

## 2021-07-04 LAB — COMPREHENSIVE METABOLIC PANEL
ALT: 17 U/L (ref 0–44)
AST: 16 U/L (ref 15–41)
Albumin: 4.2 g/dL (ref 3.5–5.0)
Alkaline Phosphatase: 77 U/L (ref 38–126)
Anion gap: 9 (ref 5–15)
BUN: 16 mg/dL (ref 6–20)
CO2: 28 mmol/L (ref 22–32)
Calcium: 9.6 mg/dL (ref 8.9–10.3)
Chloride: 107 mmol/L (ref 98–111)
Creatinine, Ser: 1.07 mg/dL (ref 0.61–1.24)
GFR, Estimated: >60 mL/min (ref >60)
Glucose, Bld: 160 mg/dL — ABNORMAL HIGH (ref 70–99)
Potassium: 4.6 mmol/L (ref 3.5–5.1)
Sodium: 144 mmol/L (ref 135–145)
Total Bilirubin: 0.8 mg/dL (ref 0.3–1.2)
Total Protein: 8.1 g/dL (ref 6.5–8.1)

## 2021-07-04 LAB — CBC WITH DIFFERENTIAL/PLATELET
Abs Immature Granulocytes: 0.02 10*3/uL (ref 0.00–0.07)
Basophils Absolute: 0 10*3/uL (ref 0.0–0.1)
Basophils Relative: 0 %
Eosinophils Absolute: 0 10*3/uL (ref 0.0–0.5)
Eosinophils Relative: 0 %
HCT: 49.4 % (ref 39.0–52.0)
Hemoglobin: 16.2 g/dL (ref 13.0–17.0)
Immature Granulocytes: 0 %
Lymphocytes Relative: 14 %
Lymphs Abs: 1.3 10*3/uL (ref 0.7–4.0)
MCH: 29 pg (ref 26.0–34.0)
MCHC: 32.8 g/dL (ref 30.0–36.0)
MCV: 88.4 fL (ref 80.0–100.0)
Monocytes Absolute: 1 10*3/uL (ref 0.1–1.0)
Monocytes Relative: 11 %
Neutro Abs: 6.8 10*3/uL (ref 1.7–7.7)
Neutrophils Relative %: 75 %
Platelets: 328 10*3/uL (ref 150–400)
RBC: 5.59 MIL/uL (ref 4.22–5.81)
RDW: 12.9 % (ref 11.5–15.5)
WBC: 9.2 10*3/uL (ref 4.0–10.5)
nRBC: 0 % (ref 0.0–0.2)

## 2021-07-04 LAB — RESP PANEL BY RT-PCR (FLU A&B, COVID) ARPGX2
Influenza A by PCR: NEGATIVE
Influenza B by PCR: NEGATIVE
SARS Coronavirus 2 by RT PCR: NEGATIVE

## 2021-07-04 MED ORDER — PREDNISONE 10 MG PO TABS
40.0000 mg | ORAL_TABLET | Freq: Every day | ORAL | 0 refills | Status: AC
  Filled 2021-07-04: qty 20, 5d supply, fill #0

## 2021-07-04 MED ORDER — IOHEXOL 350 MG/ML SOLN
100.0000 mL | Freq: Once | INTRAVENOUS | Status: AC | PRN
  Administered 2021-07-04: 100 mL via INTRAVENOUS

## 2021-07-04 MED ORDER — MORPHINE SULFATE (PF) 4 MG/ML IV SOLN
4.0000 mg | Freq: Once | INTRAVENOUS | Status: AC
  Administered 2021-07-04: 4 mg via INTRAVENOUS
  Filled 2021-07-04: qty 1

## 2021-07-04 MED ORDER — DOXYCYCLINE HYCLATE 100 MG PO TABS
100.0000 mg | ORAL_TABLET | Freq: Two times a day (BID) | ORAL | 0 refills | Status: DC
  Filled 2021-07-04: qty 20, 10d supply, fill #0

## 2021-07-04 NOTE — ED Notes (Signed)
Patient has no concerns after AVS has been reviewed and patient education provided. Patient discharged. 

## 2021-07-04 NOTE — Discharge Instructions (Addendum)
Please follow-up with your regular doctor regarding your symptoms.  I have consulted our social work team regarding hopefully getting you oxygen for your home.  They should be reaching out to you.  I am going to prescribe you an antibiotic called doxycycline.  Please take this twice a day for the next 10 days.  I am also going to prescribe you prednisone.  Please take this in the morning for the next 5 days.  These medications should help with your shortness of breath.  If you develop any new or worsening symptoms please come back to the emergency department.  It was a pleasure to meet you.

## 2021-07-04 NOTE — ED Triage Notes (Signed)
Pt BIB [facility] from home. Patient c/o back pain X3 days. First noticed the pain while sitting in a chair and the pain progressivly became worse, today patient is unable to stand/ walk due to pain. HX of scoliosis and diabetes. [facility] reports shallow breathing upon arrival. Patient o2 was 87 on arrival and went up to 93-94% on 4L. AxOx4. No reports of a recent fall.  136/74 99-HR 20-24-RR unlabored

## 2021-07-04 NOTE — ED Notes (Signed)
Upon return from CT, attempted to wean patient to room air since he does not use oxygen at home.  Patient's oxygen saturations dropped to 89% on room air, placed patient back on 3L with improvement in saturations to 94-97%. [place], [place] made aware.

## 2021-07-04 NOTE — ED Notes (Signed)
Wallace Cullens, EDP took patient off oxygen, saturations with patient asleep is 87-90%. Wallace Cullens, EDp aware and ok with oxygen saturations.

## 2021-07-04 NOTE — ED Notes (Signed)
Patients mother would like a call back with an update : [phone] Burna Mortimer)

## 2021-07-04 NOTE — ED Notes (Signed)
Patient transported to CT 

## 2021-07-04 NOTE — ED Provider Notes (Signed)
[facility]-EMERGENCY DEPT Provider Note   CSN: [id number] Arrival date & time: 07/04/21  1414     History No chief complaint on file.   Allen Hernandez is a 60 y.o. male.  HPI Patient is a 60 year old male with a history of diabetes mellitus, neuropathy, renal disorder, respiratory failure, sacroiliac dysfunction, scoliosis, low back pain, who presents to the emergency department due to low back pain.  States his symptoms started about 3 days ago.  Denies any falls or injuries.  States his symptoms have been progressively worsening and he is now unable to stand and walk today due to his pain.  States his pain radiates from his back through his abdomen.  Also reports shortness of breath.  Upon EMS arrival he was found to be saturating around 87% and was placed on 4 L via nasal cannula and this improved to 93 to 94%.  Denies any chest pain.  States he smokes about 1/4 pack/day.  No cough, runny nose, fevers, chills, urinary complaints.    Past Medical History:  Diagnosis Date   Diabetes (HCC)    recently diagnosed - 3 months ago   Neuropathy due to secondary diabetes (HCC) 08/2014   Pneumonia    Renal disorder    Respiratory failure [facility])     Patient Active Problem List   Diagnosis Date Noted   Chest pain 02/07/2018   Chronic myofascial pain 02/19/2015   Sacroiliac dysfunction 02/19/2015   Type 2 diabetes mellitus without complication (HCC) 07/13/2014   Scoliosis (and kyphoscoliosis), idiopathic 11/04/2013   Low back pain 11/04/2013    Past Surgical History:  Procedure Laterality Date   SPINE SURGERY         Family History  Problem Relation Age of Onset   Cancer Mother    CAD Father    Diabetes Brother     Social History   Tobacco Use   Smoking status: Every Day    Packs/day: 0.25    Types: Cigarettes   Smokeless tobacco: Never  Substance Use Topics   Alcohol use: Yes    Alcohol/week: 4.0 standard drinks    Types: 4 Cans of beer per week     Comment: occasionally   Drug use: Yes    Types: Cocaine    Home Medications Prior to Admission medications   Medication Sig Start Date End Date Taking? Authorizing Provider  doxycycline (VIBRAMYCIN) 100 MG capsule Take 1 capsule (100 mg total) by mouth 2 (two) times daily. 07/04/21  Yes Placido Sou, PA-C  predniSONE (DELTASONE) 10 MG tablet Take 4 tablets (40 mg total) by mouth daily with breakfast for 5 days. 07/04/21 07/09/21 Yes Placido Sou, PA-C  famotidine (PEPCID) 20 MG tablet Take 1 tablet (20 mg total) by mouth 2 (two) times daily. Patient not taking: Reported on 02/07/2018 04/04/17   Renne Crigler, PA-C  gabapentin (NEURONTIN) 300 MG capsule Take 1 capsule (300 mg total) by mouth 3 (three) times daily. Patient not taking: Reported on 02/07/2018 01/04/15   Quentin Angst, MD  glipiZIDE (GLUCOTROL) 10 MG tablet Take 1 tablet (10 mg total) by mouth daily before breakfast. 07/29/18   Hedges, Tinnie Gens, PA-C  metoCLOPramide (REGLAN) 5 MG tablet Take 1 tablet (5 mg total) by mouth 3 (three) times daily before meals. 02/08/18   Arnetha Courser, MD  omeprazole (PRILOSEC) 20 MG capsule Take 20 mg by mouth daily.    [provider]  oxyCODONE (ROXICODONE) 15 MG immediate release tablet TK 1 T PO  TID 04/03/17   [provider]  pioglitazone (ACTOS) 15 MG tablet Take 1 tablet (15 mg total) by mouth daily. 02/08/18 02/08/19  Arnetha Courser, MD    Allergies    Patient has no known allergies.  Review of Systems   Review of Systems  All other systems reviewed and are negative. Ten systems reviewed and are negative for acute change, except as noted in the HPI.   Physical Exam Updated Vital Signs BP 122/68 (BP Location: Left Arm)   Pulse 88   Temp 99.7 F (37.6 C) (Oral)   Resp 18   Ht 5\' 9"  (1.753 m)   Wt 62 kg   SpO2 90%   BMI 20.19 kg/m   Physical Exam Vitals and nursing note reviewed.  Constitutional:      General: He is not in acute distress.    Appearance:  Normal appearance. He is not ill-appearing, toxic-appearing or diaphoretic.  HENT:     Head: Normocephalic and atraumatic.     Right Ear: External ear normal.     Left Ear: External ear normal.     Nose: Nose normal.     Mouth/Throat:     Mouth: Mucous membranes are moist.     Pharynx: Oropharynx is clear. No oropharyngeal exudate or posterior oropharyngeal erythema.  Eyes:     General: No scleral icterus.       Right eye: No discharge.        Left eye: No discharge.     Extraocular Movements: Extraocular movements intact.     Conjunctiva/sclera: Conjunctivae normal.  Cardiovascular:     Rate and Rhythm: Normal rate and regular rhythm.     Pulses: Normal pulses.     Heart sounds: Normal heart sounds. No murmur heard.   No friction rub. No gallop.  Pulmonary:     Effort: Pulmonary effort is normal. No respiratory distress.     Breath sounds: Normal breath sounds. No stridor. No wheezing, rhonchi or rales.  Abdominal:     General: Abdomen is flat.     Palpations: Abdomen is soft.     Tenderness: There is abdominal tenderness.     Comments: Abdomen is flat and soft.  Diffuse tenderness appreciated with deep palpation across the abdomen.  Musculoskeletal:        General: Tenderness present. Normal range of motion.     Cervical back: Normal range of motion and neck supple. No tenderness.     Comments: No midline cervical or thoracic spine tenderness.  Diffuse midline lumbar spine pain.  Additional moderate tenderness appreciated along the bilateral lumbar musculature.  2+ pedal pulses.  Distal sensation intact.  Skin:    General: Skin is warm and dry.  Neurological:     General: No focal deficit present.     Mental Status: He is alert and oriented to person, place, and time.     Comments: Moving all 4 extremities.  A&O x3.  Strength is 5/5 in all 4 extremities.  No gross deficits.  Psychiatric:        Mood and Affect: Mood normal.        Behavior: Behavior normal.   ED Results /  Procedures / Treatments   Labs (all labs ordered are listed, but only abnormal results are displayed) Labs Reviewed  COMPREHENSIVE METABOLIC PANEL - Abnormal; Notable for the following components:      Result Value   Glucose, Bld 160 (*)    All other components within normal limits  RESP  PANEL BY RT-PCR (FLU A&B, COVID) [id number]  CBC WITH DIFFERENTIAL/PLATELET  URINALYSIS, ROUTINE W REFLEX MICROSCOPIC  TROPONIN I (HIGH SENSITIVITY)  TROPONIN I (HIGH SENSITIVITY)    EKG None  Radiology CT L-SPINE NO CHARGE  Result Date: 07/04/2021 CLINICAL DATA:  SOB, hypoxia, diffuse pain through abdomen and low back Abdominal pain, aortic dissection suspected SOB, hypoxia, diffuse pain through abdomen and low back EXAM: CT ANGIOGRAPHY CHEST, ABDOMEN AND PELVIS CT LUMBAR SPINE WITHOUT CONTRAST TECHNIQUE: Non-contrast CT of the chest was initially obtained. Multidetector CT imaging through the chest, abdomen and pelvis was performed using the standard protocol during bolus administration of intravenous contrast. Multiplanar reconstructed images and MIPs were obtained and reviewed to evaluate the vascular anatomy. Multidetector CT imaging of the lumbar spine was performed without intravenous contrast administration. Multiplanar CT image reconstructions were also generated. CONTRAST:  OMNIPAQUE IOHEXOL 350 MG/ML SOLN COMPARISON:  None. FINDINGS: CTA CHEST FINDINGS Cardiovascular: Preferential opacification of the thoracic aorta. No evidence of thoracic aortic aneurysm or dissection. No atherosclerotic plaque of the thoracic aorta. Normal heart size. No significant pericardial effusion. No coronary artery calcifications. The main pulmonary artery is normal in caliber. No central pulmonary embolus. Mediastinum/Nodes: No enlarged mediastinal, hilar, or axillary lymph nodes. Thyroid gland, trachea, and esophagus demonstrate no significant findings. Lungs/Pleura: Moderate severe centrilobular emphysematous changes.  Left lower lobe atelectasis. Lingular atelectasis. No focal consolidation. No pulmonary nodule. No pulmonary mass. No pleural effusion. No pneumothorax. Musculoskeletal: No chest wall abnormality. No suspicious lytic or blastic osseous lesions. No acute displaced fracture. Marked dextroscoliosis of the thoracic spine centered at the T10 vertebral body with associated bilateral surgical rods. Review of the MIP images confirms the above findings. CTA ABDOMEN AND PELVIS FINDINGS VASCULAR Aorta: Normal caliber aorta without aneurysm, dissection, vasculitis or significant stenosis. Celiac: Patent without evidence of aneurysm, dissection, vasculitis or significant stenosis. SMA: Patent without evidence of aneurysm, dissection, vasculitis or significant stenosis. Renals: Both renal arteries are patent without evidence of aneurysm, dissection, vasculitis, fibromuscular dysplasia or significant stenosis. IMA: Patent without evidence of aneurysm, dissection, vasculitis or significant stenosis. Inflow: Mild atherosclerotic plaque. Patent without evidence of aneurysm, dissection, vasculitis or significant stenosis. Veins: No obvious venous abnormality within the limitations of this arterial phase study. Review of the MIP images confirms the above findings. NON-VASCULAR Hepatobiliary: No focal liver abnormality. No gallstones, gallbladder wall thickening, or pericholecystic fluid. No biliary dilatation. Pancreas: No focal lesion. Normal pancreatic contour. No surrounding inflammatory changes. No main pancreatic ductal dilatation. Spleen: Normal in size without focal abnormality. Adrenals/Urinary Tract: No adrenal nodule bilaterally. Bilateral kidneys enhance symmetrically. No hydronephrosis. No hydroureter. The urinary bladder is unremarkable. Stomach/Bowel: Stomach is within normal limits. No evidence of bowel wall thickening or dilatation. Stool throughout the colon. Appendix appears normal. Lymphatic: No lymphadenopathy.  Reproductive: Prostate is unremarkable. Other: No intraperitoneal free fluid. No intraperitoneal free gas. No organized fluid collection. Musculoskeletal: No abdominal wall hernia or abnormality. No suspicious lytic or blastic osseous lesions. No acute displaced fracture. Review of the MIP images confirms the above findings. CT LUMBAR SPINE Segmentation: 5 lumbar type vertebrae. Alignment: Marked levoscoliosis of the thoracolumbar spine centered at the L3 level. Thoracolumbar surgical rod extends to the L2 level. Associated severe degenerative changes of the lumbar spine with facet arthropathy, osteophyte formation, endplate sclerosis at the L3-L4 level. At least moderate right L3-L4, L4-L5, L5-S1 osseous neural foraminal stenosis. Vertebrae: No suspicious lytic or blastic osseous lesions. No acute displaced fracture. Multilevel severe degenerative changes of the spine. Paraspinal  and other soft tissues: Negative. IMPRESSION: 1. No vascular abnormality. 2.  Emphysema (ICD10-J43.9). 3. Constipation. 4. No acute displaced fracture or traumatic listhesis of the lumbar spine. Severe scoliosis status post dual rod fixation. Associated degenerative changes with at least moderate osseous neural foraminal stenosis at the L3-L4, L4-L5, L5-S1 levels. Electronically Signed   By: Tish Frederickson M.D.   On: 07/04/2021 17:10   DG Chest Portable 1 View  Result Date: 07/04/2021 CLINICAL DATA:  Shortness of breath. EXAM: PORTABLE CHEST 1 VIEW COMPARISON:  CT angio chest 07/04/2021 FINDINGS: AP portable technique. T prominent cardiac silhouette due to he heart and mediastinal contours are within normal limits. Emphysematous changes. Linear atelectasis of the lower lobe on the left and lingula. No focal consolidation. No pulmonary edema. No pleural effusion. No pneumothorax. No acute osseous abnormality. Severe thoracic dextroscoliosis with associated rod fixation. IMPRESSION: 1. No active disease. 2.  Emphysema (ICD10-J43.9).  Electronically Signed   By: Tish Frederickson M.D.   On: 07/04/2021 17:16   CT Angio Chest/Abd/Pel for Dissection W and/or Wo Contrast  Result Date: 07/04/2021 CLINICAL DATA:  SOB, hypoxia, diffuse pain through abdomen and low back Abdominal pain, aortic dissection suspected SOB, hypoxia, diffuse pain through abdomen and low back EXAM: CT ANGIOGRAPHY CHEST, ABDOMEN AND PELVIS CT LUMBAR SPINE WITHOUT CONTRAST TECHNIQUE: Non-contrast CT of the chest was initially obtained. Multidetector CT imaging through the chest, abdomen and pelvis was performed using the standard protocol during bolus administration of intravenous contrast. Multiplanar reconstructed images and MIPs were obtained and reviewed to evaluate the vascular anatomy. Multidetector CT imaging of the lumbar spine was performed without intravenous contrast administration. Multiplanar CT image reconstructions were also generated. CONTRAST:  OMNIPAQUE IOHEXOL 350 MG/ML SOLN COMPARISON:  None. FINDINGS: CTA CHEST FINDINGS Cardiovascular: Preferential opacification of the thoracic aorta. No evidence of thoracic aortic aneurysm or dissection. No atherosclerotic plaque of the thoracic aorta. Normal heart size. No significant pericardial effusion. No coronary artery calcifications. The main pulmonary artery is normal in caliber. No central pulmonary embolus. Mediastinum/Nodes: No enlarged mediastinal, hilar, or axillary lymph nodes. Thyroid gland, trachea, and esophagus demonstrate no significant findings. Lungs/Pleura: Moderate severe centrilobular emphysematous changes. Left lower lobe atelectasis. Lingular atelectasis. No focal consolidation. No pulmonary nodule. No pulmonary mass. No pleural effusion. No pneumothorax. Musculoskeletal: No chest wall abnormality. No suspicious lytic or blastic osseous lesions. No acute displaced fracture. Marked dextroscoliosis of the thoracic spine centered at the T10 vertebral body with associated bilateral surgical  rods. Review of the MIP images confirms the above findings. CTA ABDOMEN AND PELVIS FINDINGS VASCULAR Aorta: Normal caliber aorta without aneurysm, dissection, vasculitis or significant stenosis. Celiac: Patent without evidence of aneurysm, dissection, vasculitis or significant stenosis. SMA: Patent without evidence of aneurysm, dissection, vasculitis or significant stenosis. Renals: Both renal arteries are patent without evidence of aneurysm, dissection, vasculitis, fibromuscular dysplasia or significant stenosis. IMA: Patent without evidence of aneurysm, dissection, vasculitis or significant stenosis. Inflow: Mild atherosclerotic plaque. Patent without evidence of aneurysm, dissection, vasculitis or significant stenosis. Veins: No obvious venous abnormality within the limitations of this arterial phase study. Review of the MIP images confirms the above findings. NON-VASCULAR Hepatobiliary: No focal liver abnormality. No gallstones, gallbladder wall thickening, or pericholecystic fluid. No biliary dilatation. Pancreas: No focal lesion. Normal pancreatic contour. No surrounding inflammatory changes. No main pancreatic ductal dilatation. Spleen: Normal in size without focal abnormality. Adrenals/Urinary Tract: No adrenal nodule bilaterally. Bilateral kidneys enhance symmetrically. No hydronephrosis. No hydroureter. The urinary bladder is unremarkable. Stomach/Bowel: Stomach  is within normal limits. No evidence of bowel wall thickening or dilatation. Stool throughout the colon. Appendix appears normal. Lymphatic: No lymphadenopathy. Reproductive: Prostate is unremarkable. Other: No intraperitoneal free fluid. No intraperitoneal free gas. No organized fluid collection. Musculoskeletal: No abdominal wall hernia or abnormality. No suspicious lytic or blastic osseous lesions. No acute displaced fracture. Review of the MIP images confirms the above findings. CT LUMBAR SPINE Segmentation: 5 lumbar type vertebrae. Alignment:  Marked levoscoliosis of the thoracolumbar spine centered at the L3 level. Thoracolumbar surgical rod extends to the L2 level. Associated severe degenerative changes of the lumbar spine with facet arthropathy, osteophyte formation, endplate sclerosis at the L3-L4 level. At least moderate right L3-L4, L4-L5, L5-S1 osseous neural foraminal stenosis. Vertebrae: No suspicious lytic or blastic osseous lesions. No acute displaced fracture. Multilevel severe degenerative changes of the spine. Paraspinal and other soft tissues: Negative. IMPRESSION: 1. No vascular abnormality. 2.  Emphysema (ICD10-J43.9). 3. Constipation. 4. No acute displaced fracture or traumatic listhesis of the lumbar spine. Severe scoliosis status post dual rod fixation. Associated degenerative changes with at least moderate osseous neural foraminal stenosis at the L3-L4, L4-L5, L5-S1 levels. Electronically Signed   By: Tish Frederickson M.D.   On: 07/04/2021 17:10    Procedures Procedures   Medications Ordered in ED Medications  morphine 4 MG/ML injection 4 mg (4 mg Intravenous Given 07/04/21 1533)  iohexol (OMNIPAQUE) 350 MG/ML injection 100 mL (100 mLs Intravenous Contrast Given 07/04/21 1623)   ED Course  I have reviewed the triage vital signs and the nursing notes.  Pertinent labs & imaging results that were available during my care of the patient were reviewed by me and considered in my medical decision making (see chart for details).    MDM Rules/Calculators/A&P                          Pt is a 60 y.o. male who presents to the emergency department due to low back pain as well as shortness of breath.  Labs: CBC without abnormalities. CMP with a glucose of 160. Respiratory panel is negative. Troponin of 5.  Imaging: CT of the lumbar spine without contrast shows no acute displaced fracture or traumatic listhesis of the lumbar spine.  Severe scoliosis status post dual rod fixation.  Associated degenerative changes with at  least moderate osseous neural foreman of stenosis at the L3-L4, L4-L5, L5-S1 levels. CT dissection study shows no vascular abnormality.  Emphysema.  Constipation. Chest x-ray shows no active disease.  Emphysema.  I, Placido Sou, PA-C, personally reviewed and evaluated these images and lab results as part of my medical decision-making.  Imaging as noted above appears reassuring.  Patient did become hypoxic to the high 80s.  Likely secondary to his emphysema.  Oxygen was removed and oxygen saturations remaining above 90%.  Possibly experiencing a COPD exacerbation.  Will discharge on a course of prednisone as well as doxycycline for this.  Also placed a TOC consult for home oxygen for the patient which he declined having.  Patient given a dose of morphine for his acute pain and he notes significant relief.  Neurovascularly intact in the lower extremities.  No bowel or bladder incontinence.  Doubt cauda equina at this time.  Feel that he is stable for discharge at this time and he is agreeable.  Recommended follow-up with his PCP.  He verbalized understanding of the above plan.  We discussed return precautions.  His questions were answered and  he was amicable at the time of discharge.  Note: Portions of this report may have been transcribed using voice recognition software. Every effort was made to ensure accuracy; however, inadvertent computerized transcription errors may be present.   Final Clinical Impression(s) / ED Diagnoses Final diagnoses:  Lumbar back pain  SOB (shortness of breath)   Rx / DC Orders ED Discharge Orders          Ordered    doxycycline (VIBRAMYCIN) 100 MG capsule  2 times daily        07/04/21 2000    predniSONE (DELTASONE) 10 MG tablet  Daily with breakfast        07/04/21 2000             Placido Sou, PA-C 07/04/21 2007    Tanda Rockers A, DO 07/05/21 [phone]

## 2021-07-05 ENCOUNTER — Other Ambulatory Visit: Payer: Self-pay

## 2021-07-12 ENCOUNTER — Other Ambulatory Visit: Payer: Self-pay

## 2021-10-09 DIAGNOSIS — Z419 Encounter for procedure for purposes other than remedying health state, unspecified: Secondary | ICD-10-CM | POA: Diagnosis not present

## 2021-11-09 DIAGNOSIS — Z419 Encounter for procedure for purposes other than remedying health state, unspecified: Secondary | ICD-10-CM | POA: Diagnosis not present

## 2021-12-07 DIAGNOSIS — Z419 Encounter for procedure for purposes other than remedying health state, unspecified: Secondary | ICD-10-CM | POA: Diagnosis not present

## 2022-01-07 DIAGNOSIS — Z419 Encounter for procedure for purposes other than remedying health state, unspecified: Secondary | ICD-10-CM | POA: Diagnosis not present

## 2022-01-24 DIAGNOSIS — Z131 Encounter for screening for diabetes mellitus: Secondary | ICD-10-CM | POA: Diagnosis not present

## 2022-01-24 DIAGNOSIS — Z0000 Encounter for general adult medical examination without abnormal findings: Secondary | ICD-10-CM | POA: Diagnosis not present

## 2022-02-06 DIAGNOSIS — Z419 Encounter for procedure for purposes other than remedying health state, unspecified: Secondary | ICD-10-CM | POA: Diagnosis not present

## 2022-03-09 DIAGNOSIS — Z419 Encounter for procedure for purposes other than remedying health state, unspecified: Secondary | ICD-10-CM | POA: Diagnosis not present

## 2022-03-21 DIAGNOSIS — Z79899 Other long term (current) drug therapy: Secondary | ICD-10-CM | POA: Diagnosis not present

## 2022-03-21 DIAGNOSIS — G8929 Other chronic pain: Secondary | ICD-10-CM | POA: Diagnosis not present

## 2022-03-21 DIAGNOSIS — E1165 Type 2 diabetes mellitus with hyperglycemia: Secondary | ICD-10-CM | POA: Diagnosis not present

## 2022-03-21 DIAGNOSIS — Z760 Encounter for issue of repeat prescription: Secondary | ICD-10-CM | POA: Diagnosis not present

## 2022-03-21 DIAGNOSIS — Z7984 Long term (current) use of oral hypoglycemic drugs: Secondary | ICD-10-CM | POA: Diagnosis not present

## 2022-03-21 DIAGNOSIS — M5489 Other dorsalgia: Secondary | ICD-10-CM | POA: Diagnosis not present

## 2022-03-21 DIAGNOSIS — F17210 Nicotine dependence, cigarettes, uncomplicated: Secondary | ICD-10-CM | POA: Diagnosis not present

## 2022-03-21 DIAGNOSIS — M549 Dorsalgia, unspecified: Secondary | ICD-10-CM | POA: Diagnosis not present

## 2022-03-24 DIAGNOSIS — E119 Type 2 diabetes mellitus without complications: Secondary | ICD-10-CM | POA: Diagnosis not present

## 2022-03-24 DIAGNOSIS — M545 Low back pain, unspecified: Secondary | ICD-10-CM | POA: Diagnosis not present

## 2022-03-24 DIAGNOSIS — M5136 Other intervertebral disc degeneration, lumbar region: Secondary | ICD-10-CM | POA: Diagnosis not present

## 2022-03-24 DIAGNOSIS — M47816 Spondylosis without myelopathy or radiculopathy, lumbar region: Secondary | ICD-10-CM | POA: Diagnosis not present

## 2022-03-24 DIAGNOSIS — F17210 Nicotine dependence, cigarettes, uncomplicated: Secondary | ICD-10-CM | POA: Diagnosis not present

## 2022-03-30 DIAGNOSIS — Z9889 Other specified postprocedural states: Secondary | ICD-10-CM | POA: Diagnosis not present

## 2022-03-30 DIAGNOSIS — F17210 Nicotine dependence, cigarettes, uncomplicated: Secondary | ICD-10-CM | POA: Diagnosis not present

## 2022-03-30 DIAGNOSIS — S39012A Strain of muscle, fascia and tendon of lower back, initial encounter: Secondary | ICD-10-CM | POA: Diagnosis not present

## 2022-03-30 DIAGNOSIS — Z8739 Personal history of other diseases of the musculoskeletal system and connective tissue: Secondary | ICD-10-CM | POA: Diagnosis not present

## 2022-03-30 DIAGNOSIS — M549 Dorsalgia, unspecified: Secondary | ICD-10-CM | POA: Diagnosis not present

## 2022-03-30 DIAGNOSIS — G8929 Other chronic pain: Secondary | ICD-10-CM | POA: Diagnosis not present

## 2022-04-08 DIAGNOSIS — Z419 Encounter for procedure for purposes other than remedying health state, unspecified: Secondary | ICD-10-CM | POA: Diagnosis not present

## 2022-05-09 DIAGNOSIS — Z419 Encounter for procedure for purposes other than remedying health state, unspecified: Secondary | ICD-10-CM | POA: Diagnosis not present

## 2022-06-09 DIAGNOSIS — Z419 Encounter for procedure for purposes other than remedying health state, unspecified: Secondary | ICD-10-CM | POA: Diagnosis not present

## 2022-07-09 DIAGNOSIS — Z419 Encounter for procedure for purposes other than remedying health state, unspecified: Secondary | ICD-10-CM | POA: Diagnosis not present

## 2022-08-09 DIAGNOSIS — Z419 Encounter for procedure for purposes other than remedying health state, unspecified: Secondary | ICD-10-CM | POA: Diagnosis not present

## 2022-09-08 DIAGNOSIS — Z419 Encounter for procedure for purposes other than remedying health state, unspecified: Secondary | ICD-10-CM | POA: Diagnosis not present

## 2022-09-22 ENCOUNTER — Ambulatory Visit: Payer: Medicaid Other | Admitting: Family Medicine

## 2022-10-09 DIAGNOSIS — Z419 Encounter for procedure for purposes other than remedying health state, unspecified: Secondary | ICD-10-CM | POA: Diagnosis not present

## 2022-11-09 DIAGNOSIS — Z419 Encounter for procedure for purposes other than remedying health state, unspecified: Secondary | ICD-10-CM | POA: Diagnosis not present

## 2022-12-08 DIAGNOSIS — Z419 Encounter for procedure for purposes other than remedying health state, unspecified: Secondary | ICD-10-CM | POA: Diagnosis not present

## 2022-12-14 ENCOUNTER — Ambulatory Visit: Payer: No Typology Code available for payment source | Admitting: Family Medicine

## 2023-01-08 DIAGNOSIS — Z419 Encounter for procedure for purposes other than remedying health state, unspecified: Secondary | ICD-10-CM | POA: Diagnosis not present

## 2023-02-07 DIAGNOSIS — Z419 Encounter for procedure for purposes other than remedying health state, unspecified: Secondary | ICD-10-CM | POA: Diagnosis not present

## 2023-02-21 ENCOUNTER — Ambulatory Visit: Payer: No Typology Code available for payment source | Admitting: Family Medicine

## 2023-03-10 DIAGNOSIS — Z419 Encounter for procedure for purposes other than remedying health state, unspecified: Secondary | ICD-10-CM | POA: Diagnosis not present

## 2023-03-22 ENCOUNTER — Inpatient Hospital Stay (HOSPITAL_COMMUNITY)
Admission: EM | Admit: 2023-03-22 | Discharge: 2023-03-25 | DRG: 917 | Disposition: A | Discharge status: Home / Self Care | Payer: No Typology Code available for payment source | Attending: Internal Medicine | Admitting: Internal Medicine

## 2023-03-22 ENCOUNTER — Other Ambulatory Visit: Payer: Self-pay

## 2023-03-22 ENCOUNTER — Encounter (HOSPITAL_COMMUNITY): Payer: Self-pay

## 2023-03-22 DIAGNOSIS — T405X1A Poisoning by cocaine, accidental (unintentional), initial encounter: Secondary | ICD-10-CM | POA: Diagnosis present

## 2023-03-22 DIAGNOSIS — N289 Disorder of kidney and ureter, unspecified: Secondary | ICD-10-CM | POA: Diagnosis present

## 2023-03-22 DIAGNOSIS — T402X1A Poisoning by other opioids, accidental (unintentional), initial encounter: Secondary | ICD-10-CM | POA: Diagnosis not present

## 2023-03-22 DIAGNOSIS — T50904A Poisoning by unspecified drugs, medicaments and biological substances, undetermined, initial encounter: Secondary | ICD-10-CM | POA: Diagnosis not present

## 2023-03-22 DIAGNOSIS — E875 Hyperkalemia: Secondary | ICD-10-CM | POA: Diagnosis present

## 2023-03-22 DIAGNOSIS — R404 Transient alteration of awareness: Secondary | ICD-10-CM | POA: Diagnosis not present

## 2023-03-22 DIAGNOSIS — F1090 Alcohol use, unspecified, uncomplicated: Secondary | ICD-10-CM | POA: Diagnosis present

## 2023-03-22 DIAGNOSIS — G894 Chronic pain syndrome: Secondary | ICD-10-CM | POA: Diagnosis present

## 2023-03-22 DIAGNOSIS — R55 Syncope and collapse: Secondary | ICD-10-CM | POA: Diagnosis not present

## 2023-03-22 DIAGNOSIS — G929 Unspecified toxic encephalopathy: Secondary | ICD-10-CM | POA: Diagnosis present

## 2023-03-22 DIAGNOSIS — Z7984 Long term (current) use of oral hypoglycemic drugs: Secondary | ICD-10-CM

## 2023-03-22 DIAGNOSIS — E1140 Type 2 diabetes mellitus with diabetic neuropathy, unspecified: Secondary | ICD-10-CM | POA: Diagnosis present

## 2023-03-22 DIAGNOSIS — M549 Dorsalgia, unspecified: Secondary | ICD-10-CM | POA: Diagnosis present

## 2023-03-22 DIAGNOSIS — N179 Acute kidney failure, unspecified: Secondary | ICD-10-CM | POA: Diagnosis present

## 2023-03-22 DIAGNOSIS — F17210 Nicotine dependence, cigarettes, uncomplicated: Secondary | ICD-10-CM | POA: Diagnosis present

## 2023-03-22 DIAGNOSIS — J449 Chronic obstructive pulmonary disease, unspecified: Secondary | ICD-10-CM | POA: Diagnosis present

## 2023-03-22 DIAGNOSIS — T402X4A Poisoning by other opioids, undetermined, initial encounter: Principal | ICD-10-CM

## 2023-03-22 DIAGNOSIS — E1165 Type 2 diabetes mellitus with hyperglycemia: Secondary | ICD-10-CM | POA: Diagnosis present

## 2023-03-22 DIAGNOSIS — Z79899 Other long term (current) drug therapy: Secondary | ICD-10-CM

## 2023-03-22 DIAGNOSIS — Z8249 Family history of ischemic heart disease and other diseases of the circulatory system: Secondary | ICD-10-CM

## 2023-03-22 DIAGNOSIS — F1110 Opioid abuse, uncomplicated: Secondary | ICD-10-CM | POA: Diagnosis present

## 2023-03-22 DIAGNOSIS — F1910 Other psychoactive substance abuse, uncomplicated: Secondary | ICD-10-CM | POA: Diagnosis present

## 2023-03-22 DIAGNOSIS — F1410 Cocaine abuse, uncomplicated: Secondary | ICD-10-CM | POA: Diagnosis present

## 2023-03-22 DIAGNOSIS — Z833 Family history of diabetes mellitus: Secondary | ICD-10-CM

## 2023-03-22 DIAGNOSIS — R0689 Other abnormalities of breathing: Secondary | ICD-10-CM | POA: Diagnosis not present

## 2023-03-22 DIAGNOSIS — J9601 Acute respiratory failure with hypoxia: Secondary | ICD-10-CM | POA: Diagnosis not present

## 2023-03-22 DIAGNOSIS — R739 Hyperglycemia, unspecified: Secondary | ICD-10-CM | POA: Diagnosis not present

## 2023-03-22 LAB — I-STAT CHEM 8, ED
BUN: 17 mg/dL (ref 6–20)
Calcium, Ion: 1.14 mmol/L — ABNORMAL LOW (ref 1.15–1.40)
Chloride: 104 mmol/L (ref 98–111)
Creatinine, Ser: 1.4 mg/dL — ABNORMAL HIGH (ref 0.61–1.24)
Glucose, Bld: 431 mg/dL — ABNORMAL HIGH (ref 70–99)
HCT: 46 % (ref 39.0–52.0)
Hemoglobin: 15.6 g/dL (ref 13.0–17.0)
Potassium: 5.9 mmol/L — ABNORMAL HIGH (ref 3.5–5.1)
Sodium: 137 mmol/L (ref 135–145)
TCO2: 28 mmol/L (ref 22–32)

## 2023-03-22 MED ORDER — NALOXONE HCL 2 MG/2ML IJ SOSY
2.0000 mg | PREFILLED_SYRINGE | Freq: Once | INTRAMUSCULAR | Status: AC
  Administered 2023-03-22: 2 mg via INTRAVENOUS
  Filled 2023-03-22: qty 2

## 2023-03-22 NOTE — ED Provider Notes (Signed)
  Provider Note MRN:  [id number]  Arrival date & time: 03/23/23    ED Course and Medical Decision Making  Assumed care from Dr. Jeraldine Loots at shift change.  Overdose, has responded to Narcan in the field as well as Narcan here in the emergency department.  Will allow for further metabolization.  3:30 AM update: Patient required another dose of 1 mg Narcan for respiratory rate less than 7.  Attempted to observe him for a few more hours to see if he would settle out.  At this time patient's respiratory rate is 4.  Starting Narcan drip, will admit to ICU.  Marland KitchenCritical Care  Performed by: Sabas Sous, MD Authorized by: Sabas Sous, MD   Critical care provider statement:    Critical care time (minutes):  80   Critical care was necessary to treat or prevent imminent or life-threatening deterioration of the following conditions:  Toxidrome   Critical care was time spent personally by me on the following activities:  Development of treatment plan with patient or surrogate, discussions with consultants, evaluation of patient's response to treatment, examination of patient, ordering and review of laboratory studies, ordering and review of radiographic studies, ordering and performing treatments and interventions, pulse oximetry, re-evaluation of patient's condition and review of old charts   Final Clinical Impressions(s) / ED Diagnoses     ICD-10-CM   1. Opioid overdose, undetermined intent, initial encounter [facility])  W09.8J1B       ED Discharge Orders     None       Discharge Instructions   None     Elmer Sow. Pilar Plate, MD [facility] Emergency Medicine [facility] [email or web address]    Sabas Sous, MD 03/23/23 [phone]

## 2023-03-22 NOTE — ED Provider Notes (Signed)
[place] EMERGENCY DEPARTMENT AT [facility] Provider Note   HPI: Allen Hernandez is a 62 year old male with a past medical history as below presenting today with concerns of an overdose.  The patient arrives with EMS.  The patient was at a friend's home when he went unconscious.  Another member from this household was reportedly overdose responsive to Narcan earlier.  The patient endorses doing crack cocaine today.  He was initially unresponsive and was given 2 mg of Narcan.  EMS reports the patient steadily had a response to this and began to spontaneously breathe on his own.  The patient denies any other substance use.  He denies SI, HI, AVH.  He endorses this was unintentional and he endorses only using crack cocaine today.  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   Vitals:   03/22/23 2045 03/22/23 2100  BP: 119/85 117/77  Pulse:  81  Resp: (!) 27 13  Temp:    SpO2:  96%    Physical Exam Vitals and nursing note reviewed.  Constitutional:      General: He is sleeping. He is not in acute distress.    Appearance: He is well-developed.  HENT:     Head: Normocephalic and atraumatic.  Cardiovascular:     Rate and Rhythm: Normal rate and regular rhythm.     Pulses: Normal pulses.     Heart sounds: Normal  heart sounds. No murmur heard.    No friction rub. No gallop.  Pulmonary:     Effort: Pulmonary effort is normal. No respiratory distress.     Breath sounds: Normal breath sounds. No stridor. No wheezing, rhonchi or rales.  Abdominal:     General: Abdomen is flat. There is no distension.     Tenderness: There is no abdominal tenderness. There is no guarding or rebound.  Musculoskeletal:        General: No swelling.  Skin:    General: Skin is warm and dry.     Capillary Refill: Capillary refill takes less than 2 seconds.  Neurological:     Mental Status: He is easily aroused.     GCS: GCS eye subscore is 4. GCS verbal subscore is 5. GCS motor subscore is 6.  Psychiatric:        Mood and Affect: Mood normal.     Procedures  MDM:   Lab results:  Labs Reviewed  I-STAT CHEM 8, ED - Abnormal; Notable for the following components:      Result Value   Potassium 5.9 (*)    Creatinine, Ser 1.40 (*)    Glucose, Bld 431 (*)  Calcium, Ion 1.14 (*)    All other components within normal limits  BASIC METABOLIC PANEL    Key medications administered in the ER:  Medications  naloxone (NARCAN) injection 2 mg (2 mg Intravenous Given 03/22/23 2121)   Medical decision making: -Vital signs stable. Patient afebrile, hemodynamically stable, and non-toxic appearing. -Patient's presentation is most consistent with acute presentation with potential threat to life or bodily function.. Allen Hernandez is a 62 y.o. male presenting to the emergency department with an overdose.  -Additional history obtained from EMS as above report they were initially bagging until the patient had spontaneous respirations with administration of Narcan. -On arrival, patient has spontaneous respirations and is awake and alert.  He denies SI, HI, AVH.  He denies this was an intentional ingestion.  Do not believe emergent psychiatric consultation is indicated.  He endorses using cocaine however I am suspicious that the  substance use may have contained fentanyl or some other opioid given his response to Narcan.  Patient placed on continuous monitoring with end-tidal capnography.  CHEM panel shows potassium 5.9 which is likely hemolyzed we will send BMP for further evaluation.  Patient is also with hyperglycemia will further characterize with BMP which is currently pending.  Upon reassessment, patient with respirations less than 10 and minimally responsive.  Given 2 mg of Narcan with immediate response resulting in return of respirations and that the patient is awake and alert.  Plan at signout is to allow the patient to metabolize and reassess.  If patient requires additional Narcan consider admission and if he metabolizes and does not require additional Narcan can consider discharge. Pt care was handed off to oncoming team at 0000.  Complete history and physical and current plan have been communicated.  Please refer to their note for the remainder of ED care and ultimate disposition.     Medical Decision Making Amount and/or Complexity of Data Reviewed Labs: ordered. Decision-making details documented in ED Course.  Risk Prescription drug management. Decision regarding hospitalization.     The plan for this patient was discussed with Dr. Jeraldine Loots, who voiced agreement and who oversaw evaluation and treatment of this patient.  Marta Lamas, MD Emergency Medicine, PGY-3  Note: Dragon medical dictation software was used in the creation of this note.   Clinical Impression:  1. Opioid overdose, undetermined intent, initial encounter [facility])          Chase Caller, MD 03/23/23 0900    Gerhard Munch, MD 03/26/23 [phone]

## 2023-03-22 NOTE — ED Triage Notes (Signed)
Pt BIB [facility] from friend's home when he went unconscious. Pt has pinpoint pupils. Pt has a hx of doing crack cocaine.   EMS VS BP 160/110 HR 160s-170s CBG 409

## 2023-03-23 ENCOUNTER — Other Ambulatory Visit (HOSPITAL_COMMUNITY): Payer: Self-pay

## 2023-03-23 ENCOUNTER — Inpatient Hospital Stay (HOSPITAL_COMMUNITY): Payer: No Typology Code available for payment source

## 2023-03-23 DIAGNOSIS — Z8249 Family history of ischemic heart disease and other diseases of the circulatory system: Secondary | ICD-10-CM | POA: Diagnosis not present

## 2023-03-23 DIAGNOSIS — G894 Chronic pain syndrome: Secondary | ICD-10-CM | POA: Diagnosis present

## 2023-03-23 DIAGNOSIS — J9601 Acute respiratory failure with hypoxia: Secondary | ICD-10-CM | POA: Diagnosis not present

## 2023-03-23 DIAGNOSIS — E1140 Type 2 diabetes mellitus with diabetic neuropathy, unspecified: Secondary | ICD-10-CM | POA: Diagnosis present

## 2023-03-23 DIAGNOSIS — M549 Dorsalgia, unspecified: Secondary | ICD-10-CM | POA: Diagnosis present

## 2023-03-23 DIAGNOSIS — F1410 Cocaine abuse, uncomplicated: Secondary | ICD-10-CM | POA: Diagnosis present

## 2023-03-23 DIAGNOSIS — Z79899 Other long term (current) drug therapy: Secondary | ICD-10-CM | POA: Diagnosis not present

## 2023-03-23 DIAGNOSIS — T405X1A Poisoning by cocaine, accidental (unintentional), initial encounter: Secondary | ICD-10-CM | POA: Diagnosis present

## 2023-03-23 DIAGNOSIS — F1110 Opioid abuse, uncomplicated: Secondary | ICD-10-CM | POA: Diagnosis present

## 2023-03-23 DIAGNOSIS — E875 Hyperkalemia: Secondary | ICD-10-CM | POA: Diagnosis present

## 2023-03-23 DIAGNOSIS — G929 Unspecified toxic encephalopathy: Secondary | ICD-10-CM | POA: Diagnosis present

## 2023-03-23 DIAGNOSIS — E1165 Type 2 diabetes mellitus with hyperglycemia: Secondary | ICD-10-CM

## 2023-03-23 DIAGNOSIS — T402X1A Poisoning by other opioids, accidental (unintentional), initial encounter: Secondary | ICD-10-CM | POA: Diagnosis present

## 2023-03-23 DIAGNOSIS — N179 Acute kidney failure, unspecified: Secondary | ICD-10-CM

## 2023-03-23 DIAGNOSIS — F1090 Alcohol use, unspecified, uncomplicated: Secondary | ICD-10-CM | POA: Diagnosis present

## 2023-03-23 DIAGNOSIS — F17210 Nicotine dependence, cigarettes, uncomplicated: Secondary | ICD-10-CM | POA: Diagnosis present

## 2023-03-23 DIAGNOSIS — F1910 Other psychoactive substance abuse, uncomplicated: Secondary | ICD-10-CM | POA: Diagnosis present

## 2023-03-23 DIAGNOSIS — Z7984 Long term (current) use of oral hypoglycemic drugs: Secondary | ICD-10-CM | POA: Diagnosis not present

## 2023-03-23 DIAGNOSIS — J449 Chronic obstructive pulmonary disease, unspecified: Secondary | ICD-10-CM | POA: Diagnosis present

## 2023-03-23 DIAGNOSIS — Z833 Family history of diabetes mellitus: Secondary | ICD-10-CM | POA: Diagnosis not present

## 2023-03-23 DIAGNOSIS — N289 Disorder of kidney and ureter, unspecified: Secondary | ICD-10-CM | POA: Diagnosis present

## 2023-03-23 LAB — BASIC METABOLIC PANEL
Anion gap: 14 (ref 5–15)
Anion gap: 9 (ref 5–15)
BUN: 12 mg/dL (ref 6–20)
BUN: 12 mg/dL (ref 6–20)
CO2: 21 mmol/L — ABNORMAL LOW (ref 22–32)
CO2: 25 mmol/L (ref 22–32)
Calcium: 9 mg/dL (ref 8.9–10.3)
Calcium: 9.1 mg/dL (ref 8.9–10.3)
Chloride: 101 mmol/L (ref 98–111)
Chloride: 103 mmol/L (ref 98–111)
Creatinine, Ser: 1.44 mg/dL — ABNORMAL HIGH (ref 0.61–1.24)
Creatinine, Ser: 0.96 mg/dL (ref 0.61–1.24)
GFR, Estimated: 56 mL/min — ABNORMAL LOW (ref >60)
GFR, Estimated: >60 mL/min (ref >60)
Glucose, Bld: 388 mg/dL — ABNORMAL HIGH (ref 70–99)
Glucose, Bld: 263 mg/dL — ABNORMAL HIGH (ref 70–99)
Potassium: 4.6 mmol/L (ref 3.5–5.1)
Potassium: 4.6 mmol/L (ref 3.5–5.1)
Sodium: 136 mmol/L (ref 135–145)
Sodium: 137 mmol/L (ref 135–145)

## 2023-03-23 LAB — GLUCOSE, CAPILLARY
Glucose-Capillary: 300 mg/dL — ABNORMAL HIGH (ref 70–99)
Glucose-Capillary: 248 mg/dL — ABNORMAL HIGH (ref 70–99)
Glucose-Capillary: 80 mg/dL (ref 70–99)
Glucose-Capillary: 278 mg/dL — ABNORMAL HIGH (ref 70–99)
Glucose-Capillary: 254 mg/dL — ABNORMAL HIGH (ref 70–99)

## 2023-03-23 LAB — CBC
HCT: 44 % (ref 39.0–52.0)
Hemoglobin: 14.2 g/dL (ref 13.0–17.0)
MCH: 28.1 pg (ref 26.0–34.0)
MCHC: 32.3 g/dL (ref 30.0–36.0)
MCV: 87.1 fL (ref 80.0–100.0)
Platelets: 248 10*3/uL (ref 150–400)
RBC: 5.05 MIL/uL (ref 4.22–5.81)
RDW: 12.2 % (ref 11.5–15.5)
WBC: 10.7 10*3/uL — ABNORMAL HIGH (ref 4.0–10.5)
nRBC: 0 % (ref 0.0–0.2)

## 2023-03-23 LAB — URINALYSIS, MICROSCOPIC (REFLEX)

## 2023-03-23 LAB — URINALYSIS, ROUTINE W REFLEX MICROSCOPIC
Bilirubin Urine: NEGATIVE
Glucose, UA: >=500 mg/dL — AB
Hgb urine dipstick: NEGATIVE
Ketones, ur: NEGATIVE mg/dL
Leukocytes,Ua: NEGATIVE
Nitrite: NEGATIVE
Protein, ur: NEGATIVE mg/dL
Specific Gravity, Urine: 1.025 (ref 1.005–1.030)
pH: 6 (ref 5.0–8.0)

## 2023-03-23 LAB — ETHANOL: Alcohol, Ethyl (B): <10 mg/dL (ref <10)

## 2023-03-23 LAB — RAPID URINE DRUG SCREEN, HOSP PERFORMED
Amphetamines: NOT DETECTED
Barbiturates: NOT DETECTED
Benzodiazepines: NOT DETECTED
Cocaine: POSITIVE — AB
Opiates: NOT DETECTED
Tetrahydrocannabinol: NOT DETECTED

## 2023-03-23 LAB — PHOSPHORUS: Phosphorus: 6.4 mg/dL — ABNORMAL HIGH (ref 2.5–4.6)

## 2023-03-23 LAB — HEMOGLOBIN A1C
Hgb A1c MFr Bld: 12.5 % — ABNORMAL HIGH (ref 4.8–5.6)
Mean Plasma Glucose: 312.05 mg/dL

## 2023-03-23 LAB — HIV ANTIBODY (ROUTINE TESTING W REFLEX): HIV Screen 4th Generation wRfx: NONREACTIVE

## 2023-03-23 LAB — PROCALCITONIN: Procalcitonin: 0.21 ng/mL

## 2023-03-23 LAB — SALICYLATE LEVEL: Salicylate Lvl: <7 mg/dL — ABNORMAL LOW (ref 7.0–30.0)

## 2023-03-23 LAB — MRSA NEXT GEN BY PCR, NASAL: MRSA by PCR Next Gen: NOT DETECTED

## 2023-03-23 LAB — MAGNESIUM: Magnesium: 2.2 mg/dL (ref 1.7–2.4)

## 2023-03-23 MED ORDER — POLYETHYLENE GLYCOL 3350 17 G PO PACK: 17.0000 g | PACK | Freq: Every day | ORAL | Status: DC | PRN

## 2023-03-23 MED ORDER — NALOXONE HCL 4 MG/10ML IJ SOLN
0.0000 mg/h | INTRAVENOUS | Status: DC
  Filled 2023-03-23: qty 10

## 2023-03-23 MED ORDER — HEPARIN SODIUM (PORCINE) 5000 UNIT/ML IJ SOLN
5000.0000 [IU] | Freq: Three times a day (TID) | INTRAMUSCULAR | Status: DC
  Administered 2023-03-23 – 2023-03-25 (×6): 5000 [IU] via SUBCUTANEOUS
  Filled 2023-03-23 (×7): qty 1

## 2023-03-23 MED ORDER — NALOXONE HCL 4 MG/10ML IJ SOLN
0.5000 mg/h | INTRAVENOUS | Status: DC
  Administered 2023-03-23: 0.5 mg/h via INTRAVENOUS
  Filled 2023-03-23: qty 10

## 2023-03-23 MED ORDER — INSULIN ASPART 100 UNIT/ML IJ SOLN
0.0000 [IU] | INTRAMUSCULAR | Status: DC
  Administered 2023-03-23: 5 [IU] via SUBCUTANEOUS
  Administered 2023-03-23: 3 [IU] via SUBCUTANEOUS
  Administered 2023-03-23 (×2): 5 [IU] via SUBCUTANEOUS
  Administered 2023-03-24: 1 [IU] via SUBCUTANEOUS
  Administered 2023-03-24: 5 [IU] via SUBCUTANEOUS

## 2023-03-23 MED ORDER — CHLORHEXIDINE GLUCONATE CLOTH 2 % EX PADS
6.0000 | MEDICATED_PAD | Freq: Every day | CUTANEOUS | Status: DC
  Administered 2023-03-23 – 2023-03-25 (×2): 6 via TOPICAL

## 2023-03-23 MED ORDER — NALOXONE HCL 2 MG/2ML IJ SOSY
1.0000 mg | PREFILLED_SYRINGE | Freq: Once | INTRAMUSCULAR | Status: AC
  Administered 2023-03-23: 1 mg via INTRAVENOUS
  Filled 2023-03-23: qty 2

## 2023-03-23 MED ORDER — DOCUSATE SODIUM 100 MG PO CAPS: 100.0000 mg | ORAL_CAPSULE | Freq: Two times a day (BID) | ORAL | Status: DC | PRN

## 2023-03-23 NOTE — TOC Progression Note (Signed)
Transition of Care [facility]) - Progression Note    Patient Details  Name: Allen Hernandez MRN: [id number] Date of Birth: [birth year]  Transition of Care [facility]) CM/SW Contact  Tom-Johnson, Hershal Coria, RN Phone Number: 03/23/2023, 4:27 PM  Clinical Narrative:     Patient's discharge cancelled d/t decreased O2 sats. Further workup done.  CM will continue to follow as patient progresses with care towards discharge.       Barriers to Discharge: Barriers Resolved  Expected Discharge Plan and Services         Expected Discharge Date: 03/23/23               DME Arranged: N/A DME Agency: NA       HH Arranged: NA HH Agency: NA         Social Determinants of Health (SDOH) Interventions SDOH Screenings   Tobacco Use: High Risk (03/22/2023)    Readmission Risk Interventions    03/23/2023    1:12 PM  Readmission Risk Prevention Plan  Post Dischage Appt Complete  Medication Screening Complete  Transportation Screening Complete

## 2023-03-23 NOTE — TOC CAGE-AID Note (Signed)
Transition of Care [facility]) - CAGE-AID Screening   Patient Details  Name: Allen Hernandez MRN: [id number] Date of Birth: [birth year]  Transition of Care [facility]) CM/SW Contact:    Ralene Bathe, LCSW Phone Number: 03/23/2023, 1:19 PM   Clinical Narrative: LCSW met with patient at beside to complete CAGE-AID and provide resources.  Patient reported that he had been to a facility in [place] previously and knows where to go should he want to receive services.    CAGE-AID Screening: Substance Abuse Screening unable to be completed due to: : Patient Refused             Substance Abuse Education Offered: Yes  Substance abuse interventions: Patient Counseling

## 2023-03-23 NOTE — TOC Benefit Eligibility Note (Signed)
Pharmacy Patient Advocate Encounter  Insurance verification completed.    The patient is insured through [employer] [place] MEDICA[employer]   Ran test claim for Lantus Pen and the current 30 day co-pay is $4.00.  Ran test claim for Novolog FlexPen and the current 30 day co-pay is $4.00.  This test claim was processed through [facility]- copay amounts may vary at other pharmacies due to pharmacy/plan contracts, or as the patient moves through the different stages of their insurance plan.    Roland Earl, CPHT Pharmacy Patient Advocate Specialist [facility] Patient Advocate Team Direct Number: [phone]  Fax: [phone]

## 2023-03-23 NOTE — Progress Notes (Signed)
PCXR reviewed. Nothing acute PCT negative  -I wonder if there is some underlying obstructive lung disease not previously identified Plan Starting IS Walking oximetry prior to dc May need to set him up for PFTs as outpt if still hypoxic at dc  Simonne Martinet ACNP-BC [facility] Pulmonary/Critical Care Pager # [phone] OR # [phone] if no answer

## 2023-03-23 NOTE — Discharge Summary (Cosign Needed)
Physician Discharge Summary         Patient ID: Allen Hernandez MRN: [id number] DOB/AGE: 61-Aug-1962 61 y.o.  Admit date: 03/22/2023 Discharge date: 03/23/2023  Discharge Diagnoses:    Active Hospital Problems   Diagnosis Date Noted   Opioid overdose (HCC) 03/23/2023   Type 2 diabetes mellitus with diabetic neuropathy (HCC) 07/13/2014    Resolved Hospital Problems   Diagnosis Date Noted Date Resolved   AKI (acute kidney injury) (HCC) 03/23/2023 03/23/2023   Toxic encephalopathy 03/23/2023 03/23/2023      Discharge summary   62 year old male with past medical history as below, which is significant for diabetes mellitus who presented to [facility] emergency department on 6/13 via EMS for unresponsiveness after using drugs. The patient had pinpoint pupils upon arrival to the emergency department and his mental status improved with administration of Narcan. The patient admits to using crack/cocaine, but admits he may have unintentionally used opioids as well. He has been self-medicating for back pain. He has a long history of back surgeries and chronic pain and is currently without PCP after moving to [place]. He required repeated doses of Narcan in the emergency department was ultimately started on Narcan infusion. PCCM was asked to admit to ICU for management of Narcan infusion.  He was admitted to ICU. Therapeutic interventions included: IV hydration, pulse oximeter monitoring, and narcan infusion. His narcan was discontinued at 0900 . He was monitored through the rest of the am was deemed ready for dc to home that afternoon. He was instructed to refrain from future drug use.   Plan by discharge diagnosis   Polysubstance abuse c/b unintentional opioid overdose Plan Avoid elicit drug use  DM w/ neuropathy  Plan Resume home meds   Discharge Exam: Blood Pressure 110/67   Pulse 71   Temperature 98.2 F (36.8 C) (Oral)   Respiration 13   Height 5\' 8"  (1.727 m)   Weight 55.8  kg   Oxygen Saturation (Abnormal) 89%   Body Mass Index 18.70 kg/m   General: Middle-age male in no acute distress HENT: Normocephalic, atraumatic, PERRL, no JVD Lungs: Clear bilateral breath sounds Cardiovascular: Regular rate and rhythm Abdomen: Soft, nontender, nondistended Extremities: No acute deformity or edema Neuro: Drowsy, alert to verbal, oriented, nonfocal   Labs at discharge   Lab Results  Component Value Date   CREATININE 0.96 03/23/2023   BUN 12 03/23/2023   NA 137 03/23/2023   K 4.6 03/23/2023   CL 103 03/23/2023   CO2 25 03/23/2023   Lab Results  Component Value Date   WBC 10.7 (H) 03/23/2023   HGB 14.2 03/23/2023   HCT 44.0 03/23/2023   MCV 87.1 03/23/2023   PLT 248 03/23/2023   Lab Results  Component Value Date   ALT 17 07/04/2021   AST 16 07/04/2021   ALKPHOS 77 07/04/2021   BILITOT 0.8 07/04/2021   No results found for: "INR", "PROTIME"  Current radiological studies    No results found.  Disposition:    Discharge disposition: 01-Home or Self Care       Discharge Instructions     Diet - low sodium heart healthy   Complete by: As directed    Increase activity slowly   Complete by: As directed        Allergies as of 03/23/2023   No Known Allergies      Medication List     Stop taking these medications    doxycycline 100 MG tablet Commonly known as: VIBRA-TABS  famotidine 20 MG tablet Commonly known as: PEPCID   gabapentin 300 MG capsule Commonly known as: NEURONTIN   oxyCODONE 15 MG immediate release tablet Commonly known as: ROXICODONE       Take these medications    glipiZIDE 10 MG tablet Commonly known as: GLUCOTROL Take 1 tablet (10 mg total) by mouth daily before breakfast.   metoCLOPramide 5 MG tablet Commonly known as: REGLAN Take 1 tablet (5 mg total) by mouth 3 (three) times daily before meals.   omeprazole 20 MG capsule Commonly known as: PRILOSEC Take 20 mg by mouth daily.    pioglitazone 15 MG tablet Commonly known as: Actos Take 1 tablet (15 mg total) by mouth daily.         Follow-up appointment   NA Discharge Condition:    good  Physician Statement:   The Patient was personally examined, the discharge assessment and plan has been personally reviewed and I agree with ACNP Libbie Bartley's assessment and plan. 34 min minutes of time have been dedicated to discharge assessment, planning and discharge instructions.   Signed: Shelby Mattocks 03/23/2023, 12:56 PM

## 2023-03-23 NOTE — TOC Transition Note (Signed)
Transition of Care [facility]) - CM/SW Discharge Note   Patient Details  Name: Allen Hernandez MRN: [id number] Date of Birth: 06-13-1961  Transition of Care [facility]) CM/SW Contact:  Ralene Bathe, LCSW Phone Number: 03/23/2023, 1:21 PM   Clinical Narrative:    Patient will DC to: home with self care Anticipated DC date: 03/23/2023 Transport by: Pennie Banter   Per MD patient ready for DC home. Substance use resources offered and patient declined.  Patient does not have transportation home and requested cab voucher.  Cab voucher and transportation waiver will be given to floor RN.   CSW will sign off for now as social work intervention is no longer needed. Please consult [place] again if new needs arise.    Final next level of care: Home/Self Care Barriers to Discharge: Barriers Resolved   Patient Goals and CMS Choice CMS Medicare.gov Compare Post Acute Care list provided to:: Patient Choice offered to / list presented to : NA  Discharge Placement                  Patient to be transferred to facility by: Family      Discharge Plan and Services Additional resources added to the After Visit Summary for                  DME Arranged: N/A DME Agency: NA       HH Arranged: NA HH Agency: NA        Social Determinants of Health (SDOH) Interventions SDOH Screenings   Tobacco Use: High Risk (03/22/2023)     Readmission Risk Interventions    03/23/2023    1:12 PM  Readmission Risk Prevention Plan  Post Dischage Appt Complete  Medication Screening Complete  Transportation Screening Complete

## 2023-03-23 NOTE — Progress Notes (Signed)
eLink Physician-Brief Progress Note Patient Name: Allen Hernandez DOB: Mar 31, 1961 MRN: [id number]   Date of Service  03/23/2023  HPI/Events of Note  61/M who came in with opiate overdose, requiring narcan drip.  On camera evaluation, pt easily wakes appears comfortable.  BP 130/86, HR 82, RR 17, SpO2 96% on [place]   eICU Interventions  - Will titrate narcan drip per protocol.  - Maintain on end-tidal CO2 monitoring.  - Follow up UDS - Monitor electrolutes, will correct abnormalities as warranted.  - Maintain normoglycemia.         [name] M DELA CRUZ 03/23/2023, 5:48 AM

## 2023-03-23 NOTE — ED Notes (Signed)
Pt easily alert to voice when this RN entered room.

## 2023-03-23 NOTE — H&P (Signed)
NAME:  Allen Hernandez, MRN:  [id number], DOB:  11/03/1960, LOS: 0 ADMISSION DATE:  03/22/2023, CONSULTATION DATE:  6/14 REFERRING MD:  Dr. Pilar Plate EDP, CHIEF COMPLAINT:  opioid overdose   History of Present Illness:  62 year old male with past medical history as below, which is significant for diabetes mellitus who presented to [facility] emergency department on 6/13 via EMS for unresponsiveness after using drugs.  The patient had pinpoint pupils upon arrival to the emergency department and his mental status improved with administration of Narcan.  The patient admits to using crack/cocaine, but admits he may have unintentionally used opioids as well.  He has been self-medicating for back pain.  He has a long history of back surgeries and chronic pain and is currently without PCP after moving to [place].  He required repeated doses of Narcan in the emergency department was ultimately started on Narcan infusion.  PCCM was asked to admit to ICU for management of Narcan infusion.  Pertinent  Medical History   has a past medical history of Diabetes (HCC), Neuropathy due to secondary diabetes (HCC) (08/2014), Pneumonia, Renal disorder, and Respiratory failure (HCC).   Significant Hospital Events: Including procedures, antibiotic start and stop dates in addition to other pertinent events     Interim History / Subjective:    Objective   Blood pressure 103/64, pulse 60, temperature 98.1 F (36.7 C), temperature source Oral, resp. rate (!) 9, height 5\' 8"  (1.727 m), weight 59.9 kg, SpO2 97 %.        Intake/Output Summary (Last 24 hours) at 03/23/2023 0354 Last data filed at 03/22/2023 2032 Gross per 24 hour  Intake 300 ml  Output --  Net 300 ml   Filed Weights   03/22/23 2039  Weight: 59.9 kg    Examination: General: Middle-age male in no acute distress HENT: Normocephalic, atraumatic, PERRL, no JVD Lungs: Clear bilateral breath sounds Cardiovascular: Regular rate and rhythm Abdomen:  Soft, nontender, nondistended Extremities: No acute deformity or edema Neuro: Drowsy, alert to verbal, oriented, nonfocal  Resolved Hospital Problem list     Assessment & Plan:   Opioid overdose: Unintentional Crack cocaine use -Admit to ICU for Narcan infusion -End-tidal CO2 monitoring -Cessation counseling provided -Can likely discontinue Narcan infusion in the morning -Send UDS, ASA, Acetaminophen level  Diabetes mellitus -CBG monitoring and sliding scale insulin - Send UA - Needs CBG checked  AKI Hyperkalemia -Noted on i-STAT -Will send BMP for confirmation. Check mag, phos as well.  -IV hydration  Best Practice (right click and "Reselect all SmartList Selections" daily)   Diet/type: NPO DVT prophylaxis: prophylactic heparin  GI prophylaxis: N/A Lines: N/A Foley:  N/A Code Status:  full code Last date of multidisciplinary goals of care discussion [ ]   Labs   CBC: Recent Labs  Lab 03/22/23 2043  HGB 15.6  HCT 46.0    Basic Metabolic Panel: Recent Labs  Lab 03/22/23 2043  NA 137  K 5.9*  CL 104  GLUCOSE 431*  BUN 17  CREATININE 1.40*   GFR: Estimated Creatinine Clearance: 47.5 mL/min (A) (by C-G formula based on SCr of 1.4 mg/dL (H)). No results for input(s): "PROCALCITON", "WBC", "LATICACIDVEN" in the last 168 hours.  Liver Function Tests: No results for input(s): "AST", "ALT", "ALKPHOS", "BILITOT", "PROT", "ALBUMIN" in the last 168 hours. No results for input(s): "LIPASE", "AMYLASE" in the last 168 hours. No results for input(s): "AMMONIA" in the last 168 hours.  ABG    Component Value Date/Time  TCO2 28 03/22/2023 2043     Coagulation Profile: No results for input(s): "INR", "PROTIME" in the last 168 hours.  Cardiac Enzymes: No results for input(s): "CKTOTAL", "CKMB", "CKMBINDEX", "TROPONINI" in the last 168 hours.  HbA1C: Hemoglobin A1C  Date/Time Value Ref Range Status  10/27/2014 12:15 PM 7.60  Final  07/13/2014 12:08 PM 9.5   Final   Hgb A1c MFr Bld  Date/Time Value Ref Range Status  02/07/2018 06:56 PM 8.2 (H) 4.8 - 5.6 % Final    Comment:    (NOTE) Pre diabetes:          5.7%-6.4% Diabetes:              >6.4% Glycemic control for   <7.0% adults with diabetes     CBG: No results for input(s): "GLUCAP" in the last 168 hours.  Review of Systems:    Bolds are positive  Constitutional: weight loss, gain, night sweats, Fevers, chills, fatigue .  HEENT: headaches, Sore throat, sneezing, nasal congestion, post nasal drip, Difficulty swallowing, Tooth/dental problems, visual complaints visual changes, ear ache CV:  chest pain, radiates:,Orthopnea, PND, swelling in lower extremities**, dizziness, palpitations, syncope.  GI  heartburn, indigestion, abdominal pain, nausea, vomiting, diarrhea, change in bowel habits, loss of appetite, bloody stools.  Resp: cough, productive: , hemoptysis, dyspnea, chest pain, pleuritic.  Skin: rash or itching or icterus GU: dysuria, change in color of urine, urgency or frequency. flank pain, hematuria  MS: joint pain or swelling. decreased range of motion back pain Psych: change in mood or affect. depression or anxiety.  Neuro: difficulty with speech, weakness, numbness, ataxia    Past Medical History:  He,  has a past medical history of Diabetes (HCC), Neuropathy due to secondary diabetes (HCC) (08/2014), Pneumonia, Renal disorder, and Respiratory failure (HCC).   Surgical History:   Past Surgical History:  Procedure Laterality Date   SPINE SURGERY       Social History:   reports that he has been smoking cigarettes. He has been smoking an average of .25 packs per day. He has never used smokeless tobacco. He reports current alcohol use of about 4.0 standard drinks of alcohol per week. He reports current drug use. Drug: Cocaine.   Family History:  His family history includes CAD in his father; Cancer in his mother; Diabetes in his brother.   Allergies No Known  Allergies   Home Medications  Prior to Admission medications   Medication Sig Start Date End Date Taking? Authorizing Provider  doxycycline (VIBRA-TABS) 100 MG tablet Take 1 tablet (100 mg total) by mouth 2 (two) times daily. 07/04/21   Placido Sou, PA-C  famotidine (PEPCID) 20 MG tablet Take 1 tablet (20 mg total) by mouth 2 (two) times daily. Patient not taking: Reported on 02/07/2018 04/04/17   Renne Crigler, PA-C  gabapentin (NEURONTIN) 300 MG capsule Take 1 capsule (300 mg total) by mouth 3 (three) times daily. Patient not taking: Reported on 02/07/2018 01/04/15   Quentin Angst, MD  glipiZIDE (GLUCOTROL) 10 MG tablet Take 1 tablet (10 mg total) by mouth daily before breakfast. 07/29/18   Hedges, Tinnie Gens, PA-C  metoCLOPramide (REGLAN) 5 MG tablet Take 1 tablet (5 mg total) by mouth 3 (three) times daily before meals. 02/08/18   Arnetha Courser, MD  omeprazole (PRILOSEC) 20 MG capsule Take 20 mg by mouth daily.    [provider]  oxyCODONE (ROXICODONE) 15 MG immediate release tablet TK 1 T PO TID 04/03/17   [provider]  pioglitazone (ACTOS) 15 MG tablet Take 1 tablet (15 mg total) by mouth daily. 02/08/18 02/08/19  Arnetha Courser, MD     Critical care time: 32 min     Joneen Roach, AGACNP-BC [facility]  See Amion for personal pager PCCM on call pager [phone] until 7pm. Please call Elink 7p-7a. [phone]  03/23/2023 4:03 AM

## 2023-03-23 NOTE — Progress Notes (Signed)
Narcan gtt stopped earlier.  He is awake and oriented.  We had planned on discharging this afternoon and in preporation he was to get up and ambulate. Off oxygen his resting sats were 88-89%. Attempted to ambulate him ans sats dropped as low as 82%. He was placed back on supplemental oxygen  Plan CXR now Wean O2  May need abx  Ck PCT  IS  Simonne Martinet ACNP-BC [facility] Pulmonary/Critical Care Pager # [phone] OR # [phone] if no answer

## 2023-03-23 NOTE — ED Notes (Signed)
ED TO INPATIENT HANDOFF REPORT  ED Nurse Name and Phone #: Jeannett Senior [phone]  S Name/Age/Gender Allen Hernandez 62 y.o. male Room/Bed: 018C/018C  Code Status   Code Status: Full Code  Home/SNF/Other Home Patient oriented to: self, place, time, and situation Is this baseline? Yes   Triage Complete: Triage complete  Chief Complaint Opioid overdose [facility]) [T40.2X1A]  Triage Note Pt BIB [facility] from friend's home when he went unconscious. Pt has pinpoint pupils. Pt has a hx of doing crack cocaine.   EMS VS BP 160/110 HR 160s-170s CBG 409   Allergies No Known Allergies  Level of Care/Admitting Diagnosis ED Disposition     ED Disposition  Admit   Condition  --   Comment  Hospital Area: [facility] [[id number]]  Level of Care: ICU [6]  May admit patient to [facility] or [facility] if equivalent level of care is available:: Yes  Covid Evaluation: Asymptomatic - no recent exposure (last 10 days) testing not required  Diagnosis: Opioid overdose [facility]) [[id number]]  Admitting Physician: Lorin Glass [[id number]]  Attending Physician: Lorin Glass [[id number]]  Certification:: I certify this patient will need inpatient services for at least 2 midnights  Estimated Length of Stay: 3          B Medical/Surgery History Past Medical History:  Diagnosis Date   Diabetes (HCC)    recently diagnosed - 3 months ago   Neuropathy due to secondary diabetes (HCC) 08/2014   Pneumonia    Renal disorder    Respiratory failure (HCC)    Past Surgical History:  Procedure Laterality Date   SPINE SURGERY       A IV Location/Drains/Wounds Patient Lines/Drains/Airways Status     Active Line/Drains/Airways     Name Placement date Placement time Site Days   Peripheral IV 03/22/23 20 G Left;Posterior Hand 03/22/23  2032  Hand  1   Peripheral IV 03/22/23 20 G Left Antecubital 03/22/23  2100  Antecubital  1   Pressure Injury 09/23/16 Stage II -  Partial thickness  loss of dermis presenting as a shallow open ulcer with a red, pink wound bed without slough. Pressure wound 5 cm X 2 cm on the lower mid posterior scalp.  Wound bed is pink . no drainage noted 09/23/16  1200  -- 2372   Pressure Injury 09/23/16 Stage II -  Partial thickness loss of dermis presenting as a shallow open ulcer with a red, pink wound bed without slough. 2cm X1 cm pressure wound at his sacrum, pink wound bed no drainage. 09/23/16  1547  -- 2372            Intake/Output Last 24 hours  Intake/Output Summary (Last 24 hours) at 03/23/2023 0434 Last data filed at 03/22/2023 2032 Gross per 24 hour  Intake 300 ml  Output --  Net 300 ml    Labs/Imaging Results for orders placed or performed during the hospital encounter of 03/22/23 (from the past 48 hour(s))  I-stat chem 8, ed     Status: Abnormal   Collection Time: 03/22/23  8:43 PM  Result Value Ref Range   Sodium 137 135 - 145 mmol/L   Potassium 5.9 (H) 3.5 - 5.1 mmol/L   Chloride 104 98 - 111 mmol/L   BUN 17 6 - 20 mg/dL   Creatinine, Ser 0.86 (H) 0.61 - 1.24 mg/dL   Glucose, Bld 578 (H) 70 - 99 mg/dL    Comment: Glucose reference range applies only to  samples taken after fasting for at least 8 hours.   Calcium, Ion 1.14 (L) 1.15 - 1.40 mmol/L   TCO2 28 22 - 32 mmol/L   Hemoglobin 15.6 13.0 - 17.0 g/dL   HCT 16.1 09.6 - 04.5 %   No results found.  Pending Labs Unresulted Labs (From admission, onward)     Start     Ordered   03/23/23 0500  Basic metabolic panel  Tomorrow morning,   R        03/23/23 0344   03/23/23 0500  Magnesium  Tomorrow morning,   R        03/23/23 0344   03/23/23 0500  Phosphorus  Tomorrow morning,   R        03/23/23 0344   03/23/23 0445  CBC  Once,   R        03/23/23 0445   03/23/23 0445  HIV Antibody (routine testing w rflx)  Once,   R        03/23/23 0445   03/23/23 0403  Urinalysis, Routine w reflex microscopic -Urine, Clean Catch  Once,   URGENT       Question:  Specimen Source   Answer:  Urine, Clean Catch   03/23/23 0402   03/23/23 0402  Hemoglobin A1c  Once,   URGENT       Comments: To assess prior glycemic control    03/23/23 0401   03/23/23 0401  Salicylate level  Once,   STAT        03/23/23 0400   03/23/23 0400  Rapid urine drug screen (hospital performed)  ONCE - STAT,   STAT        03/23/23 0400   03/23/23 0400  Ethanol  Once,   URGENT        03/23/23 0400   03/22/23 2200  Basic metabolic panel  Once,   R        03/22/23 2200            Vitals/Pain Today's Vitals   03/23/23 0015 03/23/23 0030 03/23/23 0300 03/23/23 0337  BP: 108/70 106/75 107/68 103/64  Pulse: 68 66 64 60  Resp: (!) 9 12 (!) 8 (!) 9  Temp:    98.1 F (36.7 C)  TempSrc:    Oral  SpO2: 93% 94% 95% 97%  Weight:      Height:      PainSc:        Isolation Precautions No active isolations  Medications Medications  naloxone HCl (NARCAN) 4 mg in dextrose 5 % 250 mL infusion (has no administration in time range)  insulin aspart (novoLOG) injection 0-9 Units (has no administration in time range)  docusate sodium (COLACE) capsule 100 mg (has no administration in time range)  polyethylene glycol (MIRALAX / GLYCOLAX) packet 17 g (has no administration in time range)  heparin injection 5,000 Units (has no administration in time range)  naloxone [facility]) injection 2 mg (2 mg Intravenous Given 03/22/23 2121)  naloxone [facility]) injection 1 mg (1 mg Intravenous Given 03/23/23 0029)    Mobility walks     Focused Assessments Cardiac Assessment Handoff:    Lab Results  Component Value Date   TROPONINI <0.03 02/07/2018   Lab Results  Component Value Date   DDIMER 0.54 (H) 02/16/2017   Does the Patient currently have chest pain? No    R Recommendations: See Admitting Provider Note  Report given to:   Additional Notes: See provider note.  Pt alert to  voice, narcan drip d/t pt apneic when not roused.

## 2023-03-23 NOTE — Inpatient Diabetes Management (Signed)
Inpatient Diabetes Program Recommendations  AACE/ADA: New Consensus Statement on Inpatient Glycemic Control (2015)  Target Ranges:  Prepandial:   less than 140 mg/dL      Peak postprandial:   less than 180 mg/dL (1-2 hours)      Critically ill patients:  140 - 180 mg/dL   Lab Results  Component Value Date   GLUCAP 248 (H) 03/23/2023   HGBA1C 12.5 (H) 03/22/2023    Review of Glycemic Control  Latest Reference Range & Units 03/23/23 05:44 03/23/23 07:50  Glucose-Capillary 70 - 99 mg/dL 161 (H) 096 (H)  (H): Data is abnormally high  Diabetes history: DM2 Outpatient Diabetes medications: Actos 15 mg QD, Glipizide 10 mg QD Current orders for Inpatient glycemic control: Novolog 0-9 units Q4H  Inpatient Diabetes Program Recommendations:    Semglee 10 units QD  Asked pharmacy for benefit check on insulins given his A1C of 12.5%.  Lantus and Novolog are covered at $4 each.  Will see patient today.  Will continue to follow while inpatient.  Thank you, Dulce Sellar, MSN, CDCES Diabetes Coordinator Inpatient Diabetes Program [phone] (team pager from 8a-5p)

## 2023-03-24 DIAGNOSIS — T402X1A Poisoning by other opioids, accidental (unintentional), initial encounter: Secondary | ICD-10-CM | POA: Diagnosis not present

## 2023-03-24 LAB — GLUCOSE, CAPILLARY
Glucose-Capillary: 127 mg/dL — ABNORMAL HIGH (ref 70–99)
Glucose-Capillary: 161 mg/dL — ABNORMAL HIGH (ref 70–99)
Glucose-Capillary: 252 mg/dL — ABNORMAL HIGH (ref 70–99)
Glucose-Capillary: 263 mg/dL — ABNORMAL HIGH (ref 70–99)
Glucose-Capillary: 336 mg/dL — ABNORMAL HIGH (ref 70–99)

## 2023-03-24 MED ORDER — INSULIN ASPART 100 UNIT/ML IJ SOLN
0.0000 [IU] | Freq: Three times a day (TID) | INTRAMUSCULAR | Status: DC
  Administered 2023-03-24: 7 [IU] via SUBCUTANEOUS
  Administered 2023-03-24: 2 [IU] via SUBCUTANEOUS
  Administered 2023-03-24: 3 [IU] via SUBCUTANEOUS
  Administered 2023-03-25 (×2): 5 [IU] via SUBCUTANEOUS

## 2023-03-24 MED ORDER — INSULIN ASPART 100 UNIT/ML IJ SOLN: 0.0000 [IU] | Freq: Three times a day (TID) | INTRAMUSCULAR | Status: DC

## 2023-03-24 MED ORDER — NICOTINE 14 MG/24HR TD PT24
14.0000 mg | MEDICATED_PATCH | Freq: Every day | TRANSDERMAL | Status: DC
  Administered 2023-03-24 – 2023-03-25 (×2): 14 mg via TRANSDERMAL
  Filled 2023-03-24 (×2): qty 1

## 2023-03-24 MED ORDER — INSULIN GLARGINE-YFGN 100 UNIT/ML ~~LOC~~ SOLN
10.0000 [IU] | Freq: Every day | SUBCUTANEOUS | Status: DC
  Administered 2023-03-24 – 2023-03-25 (×2): 10 [IU] via SUBCUTANEOUS
  Filled 2023-03-24 (×2): qty 0.1

## 2023-03-24 MED ORDER — ONDANSETRON HCL 4 MG/2ML IJ SOLN
4.0000 mg | Freq: Four times a day (QID) | INTRAMUSCULAR | Status: DC | PRN
  Administered 2023-03-24: 4 mg via INTRAVENOUS
  Filled 2023-03-24: qty 2

## 2023-03-24 MED ORDER — GABAPENTIN 300 MG PO CAPS
300.0000 mg | ORAL_CAPSULE | Freq: Three times a day (TID) | ORAL | Status: DC
  Administered 2023-03-24 – 2023-03-25 (×3): 300 mg via ORAL
  Filled 2023-03-24 (×3): qty 1

## 2023-03-24 MED ORDER — ALBUTEROL SULFATE (2.5 MG/3ML) 0.083% IN NEBU: 3.0000 mL | INHALATION_SOLUTION | Freq: Four times a day (QID) | RESPIRATORY_TRACT | Status: DC | PRN

## 2023-03-24 NOTE — Inpatient Diabetes Management (Signed)
Inpatient Diabetes Program Recommendations  AACE/ADA: New Consensus Statement on Inpatient Glycemic Control (2015)  Target Ranges:  Prepandial:   less than 140 mg/dL      Peak postprandial:   less than 180 mg/dL (1-2 hours)      Critically ill patients:  140 - 180 mg/dL   Lab Results  Component Value Date   GLUCAP 127 (H) 03/24/2023   HGBA1C 12.5 (H) 03/22/2023      Discharge Recommendations: Long acting recommendations: Insulin Glargine (LANTUS) Solostar Pen 10 units qd  Hypoglycemia treatment recommendations: GVoke 1mg  Supply/Referral recommendations: Glucometer Test strips Lancet device Lancets Pen needles - standard  Thank you, Allen Hernandez. Jader Desai, RN, MSN, CDE  Diabetes Coordinator Inpatient Glycemic Control Team Team Pager [phone] (8am-5pm) 03/24/2023 9:13 AM

## 2023-03-24 NOTE — Progress Notes (Signed)
PROGRESS NOTE  Allen Hernandez  ZOX:[id number] DOB: 06/17/1961 DOA: 03/22/2023 PCP: Billee Cashing, MD   Brief Narrative: Patient is a 62 year old male with history of diabetes type 2, diabetic neuropathy, substance abuse who presented to the emergency department being unresponsive after using drugs.  On presentation, he had pinpoint pupils.  Given Narcan with improvement in the mentation.  He was not sure what drugs did use but he admitted using cocaine.  Patient says he takes cocaine for his chronic back pain.  History of back surgeries.  Does not have a primary doctor who can prescribe him medications.  Patient was admitted to the ICU service for requirement of Narcan infusion.  He is currently alert and oriented.  Found to have uncontrolled diabetes with hemoglobin A1c of more than 10.  Not discharged today as per PTs recommendation because of unstable gait.  Assessment & Plan:  Principal Problem:   Opioid overdose (HCC) Active Problems:   Type 2 diabetes mellitus with diabetic neuropathy (HCC)  Drug overdose: Uses cocaine but not sure what opioid he took.  Presented with unresponsiveness, pinpoint pupil.  Given Narcan.  Currently alert and oriented.  UDS positive for cocaine.  Counseled for cessation  COPD/acute hypoxic respiratory failure: Chest x-ray was features of COPD changes.  He  smokes half pack a day.  Counseled cessation.  Continue nicotine patch.  He needs to follow up with  pulmonary  as an outpatient.  Will prescribe inhalers on discharge.  He was requiring 2 L of oxygen now has been weaned to room air.  Uncontrolled diabetes type 2: Takes glipizide, Actos at home.  A1c more than 12.  Diabetic order consulted.  Started on long-acting insulin.  He needs to be on insulin on discharge.  Diabetic neuropathy: On gabapentin.  Chronic pain syndrome: History of chronic back pain from previous back surgeries.  He takes cocaine to relieve his pain.  He does not have a primary doctor  who can prescribe him pain medications.  We recommend to follow-up with PCP and pain management as an outpatient  Unsteady gait: Seen by PT and not recommended to be discharged today.  PT will follow him up tomorrow        DVT prophylaxis:heparin injection 5,000 Units Start: 03/23/23 1400     Code Status: Full Code  Family Communication: None at the bedside  Patient status: Inpatient  Patient is from : Home  Anticipated discharge to: Home  Estimated DC date: Tomorrow   Consultants: PCCM  Procedures: None  Antimicrobials:  Anti-infectives (From admission, onward)    None       Subjective: Patient seen and examined the bedside this morning.  He was hemodynamically stable.  Lying comfortably in bed.  Denies worsening shortness of breath or cough.  On 2 L of oxygen per minute.  Objective: Vitals:   03/24/23 0822 03/24/23 0900 03/24/23 1100 03/24/23 1157  BP:    117/60  Pulse: 67   71  Resp: 15 13 12 20   Temp:    99.1 F (37.3 C)  TempSrc:    Oral  SpO2: 92% 97% 95% 99%  Weight:      Height:        Intake/Output Summary (Last 24 hours) at 03/24/2023 1422 Last data filed at 03/24/2023 0405 Gross per 24 hour  Intake 960 ml  Output 640 ml  Net 320 ml   Filed Weights   03/22/23 2039 03/23/23 0500 03/24/23 0359  Weight: 59.9 kg 55.8 kg 55.5  kg    Examination:  General exam: Overall comfortable, not in distress HEENT: PERRL Respiratory system:  no wheezes or crackles  Cardiovascular system: S1 & S2 heard, RRR.  Gastrointestinal system: Abdomen is nondistended, soft and nontender. Central nervous system: Alert and oriented Extremities: No edema, no clubbing ,no cyanosis Skin: No rashes, no ulcers,no icterus     Data Reviewed: I have personally reviewed following labs and imaging studies  CBC: Recent Labs  Lab 03/22/23 2043 03/23/23 0606  WBC  --  10.7*  HGB 15.6 14.2  HCT 46.0 44.0  MCV  --  87.1  PLT  --  248   Basic Metabolic  Panel: Recent Labs  Lab 03/22/23 2043 03/23/23 0400 03/23/23 0606  NA 137 136 137  K 5.9* 4.6 4.6  CL 104 101 103  CO2  --  21* 25  GLUCOSE 431* 388* 263*  BUN 17 12 12   CREATININE 1.40* 1.44* 0.96  CALCIUM  --  9.0 9.1  MG  --  2.2  --   PHOS  --  6.4*  --      Recent Results (from the past 240 hour(s))  MRSA Next Gen by PCR, Nasal     Status: None   Collection Time: 03/23/23  5:36 AM   Specimen: Nasal Mucosa; Nasal Swab  Result Value Ref Range Status   MRSA by PCR Next Gen NOT DETECTED NOT DETECTED Final    Comment: (NOTE) The GeneXpert MRSA Assay (FDA approved for NASAL specimens only), is one component of a comprehensive MRSA colonization surveillance program. It is not intended to diagnose MRSA infection nor to guide or monitor treatment for MRSA infections. Test performance is not FDA approved in patients less than 61 years old. Performed at [facility], [address] [place], [place] [address]      Radiology Studies: DG Chest Port 1 View  Result Date: 03/23/2023 CLINICAL DATA:  Hypoxia EXAM: PORTABLE CHEST 1 VIEW COMPARISON:  07/04/2021 FINDINGS: Marked S shaped thoracolumbar spine curvature with prior spine fixation. Midline trachea. Mild cardiomegaly. Hyperinflation. Interstitial thickening. Numerous leads and wires project over the chest. Right-greater-than-left base scarring. IMPRESSION: Mild hyperinflation and interstitial thickening, consistent with COPD/chronic bronchitis. No acute superimposed process. Electronically Signed   By: Jeronimo Greaves M.D.   On: 03/23/2023 16:19    Scheduled Meds:  Chlorhexidine Gluconate Cloth  6 each Topical Daily   heparin  5,000 Units Subcutaneous Q8H   insulin aspart  0-9 Units Subcutaneous TID AC & HS   nicotine  14 mg Transdermal Daily   Continuous Infusions:  naloxone HCl (NARCAN) 4 mg in dextrose 5 % 250 mL infusion 0.25 mg/hr (03/23/23 0856)     LOS: 1 day   Burnadette Pop, MD [facility] P6/15/2024, 2:22 PM

## 2023-03-24 NOTE — Progress Notes (Signed)
Replaced nasal cannula 2 liters oxygen. Patient lying in bed and O2 sats dropped to 89% at rest.

## 2023-03-24 NOTE — Evaluation (Signed)
Physical Therapy Evaluation Patient Details Name: Allen Hernandez MRN: [id number] DOB: 1961/04/05 Today's Date: 03/24/2023  History of Present Illness  Pt is a 62 y.o. M who presents 03/22/2023 after being found unresponsive. Pt with some response to Narcan and was placed on a Narcan drip. Significant PMH: uncontrolled diabetes, neuropathy, PNA, renal disorder, and respiratory failure.   Clinical Impression  PTA pt reports independence in functional mobility and ADL's, lives at home with his significant other who can provide transportation needs. Today, pt w generalized weakness in BLE, increased time for functional mobility tasks and coordination tests, also requiring a RW to maintain standing balance and to perform ambulation of 80'. Pt presents with ataxic gait, knees buckling, and minor LOB throughout. Supplemental O2 needs during activity were assessed today, 92% O2 at rest with 2L [place]. Pt amb in hallway without supp O2, reports no adverse symptoms and maintains 91%+ throughout, PT doffs  post amb due to lack of desaturation, nursing notified. Pt acknowledges RW as a temporary AD and agrees to potential continuation of PT following discharge. Pt will continue to benefit from skilled PT during their acute admission to facilitate improvements in gait, functional transfers, AD management, balance, and strengthening.     Recommendations for follow up therapy are one component of a multi-disciplinary discharge planning process, led by the attending physician.  Recommendations may be updated based on patient status, additional functional criteria and insurance authorization.  Follow Up Recommendations       Assistance Recommended at Discharge Intermittent Supervision/Assistance  Patient can return home with the following  A little help with walking and/or transfers;A little help with bathing/dressing/bathroom;Assistance with cooking/housework;Assist for transportation;Help with stairs or ramp for  entrance    Equipment Recommendations Rolling walker (2 wheels)  Recommendations for Other Services       Functional Status Assessment Patient has had a recent decline in their functional status and demonstrates the ability to make significant improvements in function in a reasonable and predictable amount of time.     Precautions / Restrictions Precautions Precautions: Fall Restrictions Weight Bearing Restrictions: No      Mobility  Bed Mobility Overal bed mobility: Modified Independent             General bed mobility comments: Increased time to seated, some verbal cueing for hand placement    Transfers Overall transfer level: Needs assistance Equipment used: Rolling walker (2 wheels) Transfers: Sit to/from Stand Sit to Stand: Min guard           General transfer comment: Pt able to stand x1 attempt with no assist or RW, pushes with BUE to achieve stand. Once in stand pt w increased sway and buckling of knees, RW provided for assist.    Ambulation/Gait Ambulation/Gait assistance: Min assist Gait Distance (Feet): 80 Feet Assistive device: Rolling walker (2 wheels) Gait Pattern/deviations: Ataxic, Knees buckling, Decreased stride length Gait velocity: reduced Gait velocity interpretation: 1.31 - 2.62 ft/sec, indicative of limited community ambulator Pre-gait activities: Multidirectional steps to assess weight shifting and need for an AD, difficulty to accept challenge with weight shifting, ataxic movements General Gait Details: Reduced speed and ataxic movements, fwd progression of step almost like he is stepping over an obstacle intermittently. Lateral sway and knees buckling throughout.  Stairs            Wheelchair Mobility    Modified Rankin (Stroke Patients Only)       Balance Overall balance assessment: Needs assistance Sitting-balance support: Bilateral upper extremity supported,  Feet supported, No upper extremity supported Sitting  balance-Leahy Scale: Fair Sitting balance - Comments: Can sit EOB with no UE support but prefers to have BUE extended behind him with posterior lean in seated.   Standing balance support: No upper extremity supported, During functional activity, Reliant on assistive device for balance Standing balance-Leahy Scale: Poor Standing balance comment: Initially no UE support for standing but with increased postural sway, given RW to assist for BUE support. Difficulty with challenging weight shifting for ambulation.                             Pertinent Vitals/Pain Pain Assessment Pain Assessment: Faces Faces Pain Scale: Hurts little more Pain Location: Intermittent R knee, diffuse back pain Pain Descriptors / Indicators: Sharp, Dull Pain Intervention(s): Limited activity within patient's tolerance, Monitored during session    Home Living Family/patient expects to be discharged to:: Private residence Living Arrangements: Spouse/significant other Available Help at Discharge: Family Type of Home: Apartment Home Access: Stairs to enter   Secretary/administrator of Steps: 4   Home Layout: One level Home Equipment: None      Prior Function Prior Level of Function : Independent/Modified Independent             Mobility Comments: Pt reports full independence with functional mobility PTA.       Hand Dominance        Extremity/Trunk Assessment   Upper Extremity Assessment Upper Extremity Assessment: Overall WFL for tasks assessed;RUE deficits/detail;LUE deficits/detail RUE Deficits / Details: Increased time for finger to nose bilat RUE Coordination: decreased fine motor LUE Coordination: decreased fine motor    Lower Extremity Assessment Lower Extremity Assessment: RLE deficits/detail;LLE deficits/detail;Generalized weakness RLE Deficits / Details: At least 3/5 for hip flexion, knee ext/flex, DF/PF -- diffuse pain in back throughout. Increased time with heel to  shin RLE Coordination: decreased gross motor LLE Deficits / Details: same as RLE, pain in L knee throughout as well. Increased time for heel to shin LLE Coordination: decreased gross motor    Cervical / Trunk Assessment Cervical / Trunk Assessment: Normal  Communication   Communication: No difficulties  Cognition Arousal/Alertness: Awake/alert Behavior During Therapy: WFL for tasks assessed/performed Overall Cognitive Status: Within Functional Limits for tasks assessed                                 General Comments: A & O x4, aware to current impairments described in mobility        General Comments General comments (skin integrity, edema, etc.): Pt assessed for O2 weaning, 92% at rest with 2L and supp O2 discontinued for ambulation, 91+ throughout session. [place] doffed conluding session with no desats observed.    Exercises     Assessment/Plan    PT Assessment Patient needs continued PT services  PT Problem List Decreased strength;Decreased balance;Decreased mobility;Decreased coordination;Pain       PT Treatment Interventions Gait training;Functional mobility training;Balance training;Neuromuscular re-education    PT Goals (Current goals can be found in the Care Plan section)  Acute Rehab PT Goals Patient Stated Goal: ready to go home PT Goal Formulation: With patient Time For Goal Achievement: 04/07/23 Potential to Achieve Goals: Good    Frequency Min 1X/week     Co-evaluation               AM-PAC PT "6 Clicks" Mobility  Outcome Measure Help  needed turning from your back to your side while in a flat bed without using bedrails?: None Help needed moving from lying on your back to sitting on the side of a flat bed without using bedrails?: None Help needed moving to and from a bed to a chair (including a wheelchair)?: A Little Help needed standing up from a chair using your arms (e.g., wheelchair or bedside chair)?: A Little Help needed to walk  in hospital room?: A Little Help needed climbing 3-5 steps with a railing? : A Lot 6 Click Score: 19    End of Session Equipment Utilized During Treatment: Gait belt Activity Tolerance: Patient tolerated treatment well Patient left: in bed;with bed alarm set;with call bell/phone within reach Nurse Communication: Other (comment) (O2 status) PT Visit Diagnosis: Unsteadiness on feet (R26.81);Other abnormalities of gait and mobility (R26.89);Muscle weakness (generalized) (M62.81);Ataxic gait (R26.0);Difficulty in walking, not elsewhere classified (R26.2)    Time: 0950-1020 PT Time Calculation (min) (ACUTE ONLY): 30 min   Charges:   PT Evaluation $PT Eval Low Complexity: 1 Low PT Treatments $Therapeutic Activity: 8-22 mins        Hendricks Milo, SPT  Acute Rehabilitation Services   Hendricks Milo 03/24/2023, 11:26 AM

## 2023-03-24 NOTE — Progress Notes (Signed)
Ambulated patient in hallway on room air.  Oxygen saturation dropped to 86-88%.  Patient reported filing light headed. He was slightly unsteady, but ambulated with light arm support and close observation. O2 sat 92% in bed not moving, but patient reported that he feels "slightly winded" without the nasal cannula.  Reported observations to MD.

## 2023-03-25 DIAGNOSIS — T402X1A Poisoning by other opioids, accidental (unintentional), initial encounter: Secondary | ICD-10-CM | POA: Diagnosis not present

## 2023-03-25 LAB — GLUCOSE, CAPILLARY
Glucose-Capillary: 259 mg/dL — ABNORMAL HIGH (ref 70–99)
Glucose-Capillary: 260 mg/dL — ABNORMAL HIGH (ref 70–99)

## 2023-03-25 MED ORDER — PEN NEEDLES 32G X 5 MM MISC: 1.0000 | Freq: Two times a day (BID) | 0 refills | Status: DC

## 2023-03-25 MED ORDER — LANCET DEVICE MISC: 1.0000 | Freq: Three times a day (TID) | 0 refills | Status: AC

## 2023-03-25 MED ORDER — BLOOD GLUCOSE TEST VI STRP: 1.0000 | ORAL_STRIP | Freq: Three times a day (TID) | 0 refills | Status: AC

## 2023-03-25 MED ORDER — GABAPENTIN 600 MG PO TABS: 600.0000 mg | ORAL_TABLET | Freq: Two times a day (BID) | ORAL | 1 refills | Status: DC

## 2023-03-25 MED ORDER — BLOOD GLUCOSE MONITORING SUPPL DEVI: 1.0000 | Freq: Three times a day (TID) | 0 refills | Status: AC

## 2023-03-25 MED ORDER — LANCETS MISC. MISC: 1.0000 | Freq: Three times a day (TID) | 0 refills | Status: AC

## 2023-03-25 MED ORDER — NICOTINE 14 MG/24HR TD PT24: 14.0000 mg | MEDICATED_PATCH | Freq: Every day | TRANSDERMAL | 0 refills | Status: AC

## 2023-03-25 MED ORDER — INSULIN GLARGINE 100 UNIT/ML SOLOSTAR PEN: 10.0000 [IU] | PEN_INJECTOR | Freq: Every day | SUBCUTANEOUS | 0 refills | Status: DC

## 2023-03-25 MED ORDER — ALBUTEROL SULFATE HFA 108 (90 BASE) MCG/ACT IN AERS: 2.0000 | INHALATION_SPRAY | Freq: Four times a day (QID) | RESPIRATORY_TRACT | 2 refills | Status: DC | PRN

## 2023-03-25 NOTE — Progress Notes (Signed)
Physical Therapy Treatment Patient Details Name: Allen Hernandez MRN: [id number] DOB: [birth year] Today's Date: 03/25/2023   History of Present Illness Pt is a 62 y.o. M who presents 03/22/2023 after being found unresponsive. Pt with some response to Narcan and was placed on a Narcan drip. Significant PMH: uncontrolled diabetes, neuropathy, PNA, renal disorder, and respiratory failure.    PT Comments    Pt tolerates treatment well, ambulating for increased distances and with improved stability. Pt negotiates stairs well, PT providing cues for step-to pattern to improve stability. PT recommends use of cane when ambulating as pt reports multiple falls in the past, and still continues to demonstrate mild drift with PRN UE support of railing. PT recommends outpatient PT follow-up for balance training and management of back pain.   Recommendations for follow up therapy are one component of a multi-disciplinary discharge planning process, led by the attending physician.  Recommendations may be updated based on patient status, additional functional criteria and insurance authorization.  Follow Up Recommendations       Assistance Recommended at Discharge Intermittent Supervision/Assistance  Patient can return home with the following A little help with walking and/or transfers;A little help with bathing/dressing/bathroom;Assistance with cooking/housework;Assist for transportation;Help with stairs or ramp for entrance   Equipment Recommendations  None recommended by PT    Recommendations for Other Services       Precautions / Restrictions Precautions Precautions: Fall Restrictions Weight Bearing Restrictions: No     Mobility  Bed Mobility Overal bed mobility: Independent                  Transfers Overall transfer level: Independent                      Ambulation/Gait Ambulation/Gait assistance: Supervision Gait Distance (Feet): 200 Feet Assistive device:  (PRN  railing) Gait Pattern/deviations: Step-through pattern, Drifts right/left Gait velocity: reduced Gait velocity interpretation: 1.31 - 2.62 ft/sec, indicative of limited community ambulator   General Gait Details: mild lateral drift, no knee buckling or ataxia noted this session, slightly reduced stance time on LLE, pt reports this is due to back pain   Stairs Stairs: Yes Stairs assistance: Supervision Stair Management: One rail Left, Alternating pattern Number of Stairs: 6 General stair comments: PT encourages tep-to pattern to improve stability   Wheelchair Mobility    Modified Rankin (Stroke Patients Only)       Balance Overall balance assessment: Needs assistance Sitting-balance support: No upper extremity supported, Feet supported Sitting balance-Leahy Scale: Good     Standing balance support: No upper extremity supported, During functional activity Standing balance-Leahy Scale: Good                              Cognition Arousal/Alertness: Awake/alert Behavior During Therapy: WFL for tasks assessed/performed Overall Cognitive Status: Within Functional Limits for tasks assessed                                          Exercises      General Comments General comments (skin integrity, edema, etc.): VSS on RA at rest, pt desats to 88% at end of ambulation but quickly recovers to 93% within 1 minute of resting, no significant increase in work of breathing observed by this PT      Pertinent Vitals/Pain Pain Assessment Pain Assessment: Faces  Faces Pain Scale: Hurts little more Pain Location: back Pain Descriptors / Indicators: Grimacing Pain Intervention(s): Monitored during session    Home Living                          Prior Function            PT Goals (current goals can now be found in the care plan section) Acute Rehab PT Goals Patient Stated Goal: ready to go home Progress towards PT goals: Progressing toward  goals    Frequency    Min 1X/week      PT Plan Current plan remains appropriate    Co-evaluation              AM-PAC PT "6 Clicks" Mobility   Outcome Measure  Help needed turning from your back to your side while in a flat bed without using bedrails?: None Help needed moving from lying on your back to sitting on the side of a flat bed without using bedrails?: None Help needed moving to and from a bed to a chair (including a wheelchair)?: None Help needed standing up from a chair using your arms (e.g., wheelchair or bedside chair)?: None Help needed to walk in hospital room?: A Little Help needed climbing 3-5 steps with a railing? : A Little 6 Click Score: 22    End of Session Equipment Utilized During Treatment: Gait belt Activity Tolerance: Patient tolerated treatment well Patient left: in bed;with call bell/phone within reach Nurse Communication: Mobility status PT Visit Diagnosis: Unsteadiness on feet (R26.81);Other abnormalities of gait and mobility (R26.89);Muscle weakness (generalized) (M62.81);Ataxic gait (R26.0);Difficulty in walking, not elsewhere classified (R26.2)     Time: 4098-1191 PT Time Calculation (min) (ACUTE ONLY): 14 min  Charges:  $Gait Training: 8-22 mins                     Arlyss Gandy, PT, DPT Acute Rehabilitation Office [phone]    Arlyss Gandy 03/25/2023, 8:59 AM

## 2023-03-25 NOTE — Inpatient Diabetes Management (Signed)
Inpatient Diabetes Program Recommendations  AACE/ADA: New Consensus Statement on Inpatient Glycemic Control (2015)  Target Ranges:  Prepandial:   less than 140 mg/dL      Peak postprandial:   less than 180 mg/dL (1-2 hours)      Critically ill patients:  140 - 180 mg/dL   Lab Results  Component Value Date   GLUCAP 259 (H) 03/25/2023   HGBA1C 12.5 (H) 03/22/2023    Review of Glycemic Control Spoke with pt via phone (DM coordinator working remotely on call) about A1C 12.5 (average blood glucose 312 over the past 2-3 months) and explained what an A1C is, basic pathophysiology of DM Type 2, basic home care, basic diabetes diet nutrition principles, importance of checking CBGs and maintaining good CBG control to prevent long-term and short-term complications. Reviewed signs and symptoms of hyperglycemia and hypoglycemia and how to treat hypoglycemia at home. Also reviewed blood sugar goals at home.  RNs to provide ongoing basic DM education at bedside with this patient.  Patient states willingness to start using glucose meter to check CBGs qid and take to PCP visits when established. TOC consult regarding need for PCP.  Thank you, Billy Fischer. Taber Sweetser, RN, MSN, CDE  Diabetes Coordinator Inpatient Glycemic Control Team Team Pager [phone] (8am-5pm) 03/25/2023 10:48 AM

## 2023-03-25 NOTE — Progress Notes (Addendum)
Discharge instructions reviewed with pt.  Copy of instructions given to pt. Pt informed his scripts were sent to his pharmacy for pick up. Per DM Coordinator's notes, instructed pt to check his blood sugar 4 times a day, write in down and take it to his PCP. Pt states Dr Ronne Binning has retired, he is not sure if another MD has taken over the practice. Read to and reviewed with pt on how to find another PCP, information is on his discharge instructions.  Pt waiting for lab to come to draw last order for blood work before pt discharges. Pt's primary nurse has called the lab, and tried to reach the phlebotomist that is supposed to be coming to this unit to draw labs, not able to reach that person. Pt informed MD needs labs drawn before he leaves.   Pt has to call someone to come and pick him up he states.    Annice Needy, RN SWOT    At 1340--spoke with someone in phlebotomy and they will be up soon to draw pt's lab ordered.   LAB was added to previous lab draw per the lab dept.  Pt can leave when his ride arrives.

## 2023-03-25 NOTE — Discharge Summary (Signed)
Physician Discharge Summary  Allen Hernandez FAO:[id number] DOB: January 11, 1961 DOA: 03/22/2023  PCP: Billee Cashing, MD  Admit date: 03/22/2023 Discharge date: 03/25/2023  Admitted From: Home Disposition:  Home  Discharge Condition:Stable CODE STATUS:FULL Diet recommendation:Carb Modified  Brief/Interim Summary: Patient is a 62 year old male with history of diabetes type 2, diabetic neuropathy, substance abuse who presented to the emergency department being unresponsive after using drugs.  On presentation, he had pinpoint pupils.  Given Narcan with improvement in the mentation.  He was not sure what drugs did use but he admitted using cocaine.  Patient says he takes cocaine for his chronic back pain.  History of back surgeries.  Does not have a primary doctor who can prescribe him medications.  Patient was admitted to the ICU service for requirement of Narcan infusion.  He is currently alert and oriented.  Found to have uncontrolled diabetes with hemoglobin A1c of more than 10.  Diabetic coordinator consulted, started on insulin.  Medically stable for discharge home today.  Following problems were addressed during the hospitalization:  Drug overdose: Uses cocaine but not sure what opioid he took.  Presented with unresponsiveness, pinpoint pupil.  Given Narcan.  Currently alert and oriented.  UDS positive for cocaine.  Counseled for cessation   COPD/acute hypoxic respiratory failure: Chest x-ray was features of COPD changes.  He  smokes half pack a day.  Counseled cessation.  Continue nicotine patch. Will prescribe albuterol inhaler  on discharge.  He was requiring 2 L of oxygen now has been weaned to room air.   Uncontrolled diabetes type 2:   A1c more than 12.  Diabetic coordinator consulted. Marland Kitchen  He needs to be on insulin on discharge.   Diabetic neuropathy: On gabapentin.   Chronic pain syndrome: History of chronic back pain from previous back surgeries.  He takes cocaine to relieve his pain.   He does not have a primary doctor who can prescribe him pain medications.  We recommend to follow-up with PCP and pain management as an outpatient   Unsteady gait: Seen by PT and no follow up recommended    Discharge Diagnoses:  Principal Problem:   Opioid overdose (HCC) Active Problems:   Type 2 diabetes mellitus with diabetic neuropathy [facility])    Discharge Instructions  Discharge Instructions     Diet - low sodium heart healthy   Complete by: As directed    Diet Carb Modified   Complete by: As directed    Discharge instructions   Complete by: As directed    1)Please take prescribed medications as instructed 2)Follow up with the PCP in 1 to 2 weeks 3)Monitor your blood sugars at home 4)Quit cocaine   Increase activity slowly   Complete by: As directed    Increase activity slowly   Complete by: As directed       Allergies as of 03/25/2023   No Known Allergies      Medication List     STOP taking these medications    doxycycline 100 MG tablet Commonly known as: VIBRA-TABS   famotidine 20 MG tablet Commonly known as: PEPCID   gabapentin 300 MG capsule Commonly known as: NEURONTIN Replaced by: gabapentin 600 MG tablet   oxyCODONE 15 MG immediate release tablet Commonly known as: ROXICODONE       TAKE these medications    albuterol 108 (90 Base) MCG/ACT inhaler Commonly known as: VENTOLIN HFA Inhale 2 puffs into the lungs every 6 (six) hours as needed for wheezing or shortness of  breath.   Blood Glucose Monitoring Suppl Devi 1 each by Does not apply route in the morning, at noon, and at bedtime. May substitute to any manufacturer covered by patient's insurance.   BLOOD GLUCOSE TEST STRIPS Strp 1 each by In Vitro route in the morning, at noon, and at bedtime. May substitute to any manufacturer covered by patient's insurance.   gabapentin 600 MG tablet Commonly known as: Neurontin Take 1 tablet (600 mg total) by mouth 2 (two) times daily. Replaces:  gabapentin 300 MG capsule   insulin glargine 100 UNIT/ML Solostar Pen Commonly known as: LANTUS Inject 10 Units into the skin daily.   Lancet Device Misc 1 each by Does not apply route in the morning, at noon, and at bedtime. May substitute to any manufacturer covered by patient's insurance.   Lancets Misc. Misc 1 each by Does not apply route in the morning, at noon, and at bedtime. May substitute to any manufacturer covered by patient's insurance.   nicotine 14 mg/24hr patch Commonly known as: NICODERM CQ - dosed in mg/24 hours Place 1 patch (14 mg total) onto the skin daily. Start taking on: March 26, 2023   omeprazole 20 MG capsule Commonly known as: PRILOSEC Take 20 mg by mouth daily.   Pen Needles 32G X 5 MM Misc 1 each by Does not apply route 2 (two) times daily.        Follow-up Information     Billee Cashing, MD. Schedule an appointment as soon as possible for a visit today.   Specialty: Family Medicine Contact information: [address] [place] [address] [phone]                No Known Allergies  Consultations: PCCM   Procedures/Studies: DG Chest Port 1 View  Result Date: 03/23/2023 CLINICAL DATA:  Hypoxia EXAM: PORTABLE CHEST 1 VIEW COMPARISON:  07/04/2021 FINDINGS: Marked S shaped thoracolumbar spine curvature with prior spine fixation. Midline trachea. Mild cardiomegaly. Hyperinflation. Interstitial thickening. Numerous leads and wires project over the chest. Right-greater-than-left base scarring. IMPRESSION: Mild hyperinflation and interstitial thickening, consistent with COPD/chronic bronchitis. No acute superimposed process. Electronically Signed   By: Jeronimo Greaves M.D.   On: 03/23/2023 16:19      Subjective: Patient seen and examined the bedside today.  Hemodynamically stable.  Comfortable.  Medically stable for discharge  Discharge Exam: Vitals:   03/25/23 0406 03/25/23 0727  BP: 115/80 116/71  Pulse: 66 73  Resp: 14  18  Temp: 98.8 F (37.1 C) 98.6 F (37 C)  SpO2: 94% 93%   Vitals:   03/24/23 2359 03/25/23 0406 03/25/23 0557 03/25/23 0727  BP: 124/75 115/80  116/71  Pulse: 81 66  73  Resp: 14 14  18   Temp: 99 F (37.2 C) 98.8 F (37.1 C)  98.6 F (37 C)  TempSrc: Oral Oral  Oral  SpO2: 93% 94%  93%  Weight:   56.8 kg   Height:        General: Pt is alert, awake, not in acute distress Cardiovascular: RRR, S1/S2 +, no rubs, no gallops Respiratory: CTA bilaterally, no wheezing, no rhonchi Abdominal: Soft, NT, ND, bowel sounds + Extremities: no edema, no cyanosis    The results of significant diagnostics from this hospitalization (including imaging, microbiology, ancillary and laboratory) are listed below for reference.     Microbiology: Recent Results (from the past 240 hour(s))  MRSA Next Gen by PCR, Nasal     Status: None   Collection  Time: 03/23/23  5:36 AM   Specimen: Nasal Mucosa; Nasal Swab  Result Value Ref Range Status   MRSA by PCR Next Gen NOT DETECTED NOT DETECTED Final    Comment: (NOTE) The GeneXpert MRSA Assay (FDA approved for NASAL specimens only), is one component of a comprehensive MRSA colonization surveillance program. It is not intended to diagnose MRSA infection nor to guide or monitor treatment for MRSA infections. Test performance is not FDA approved in patients less than 25 years old. Performed at [facility], [address] [place], [place] [address]      Labs: BNP (last 3 results) No results for input(s): "BNP" in the last 8760 hours. Basic Metabolic Panel: Recent Labs  Lab 03/22/23 2043 03/23/23 0400 03/23/23 0606  NA 137 136 137  K 5.9* 4.6 4.6  CL 104 101 103  CO2  --  21* 25  GLUCOSE 431* 388* 263*  BUN 17 12 12   CREATININE 1.40* 1.44* 0.96  CALCIUM  --  9.0 9.1  MG  --  2.2  --   PHOS  --  6.4*  --    Liver Function Tests: No results for input(s): "AST", "ALT", "ALKPHOS", "BILITOT", "PROT", "ALBUMIN" in the last 168  hours. No results for input(s): "LIPASE", "AMYLASE" in the last 168 hours. No results for input(s): "AMMONIA" in the last 168 hours. CBC: Recent Labs  Lab 03/22/23 2043 03/23/23 0606  WBC  --  10.7*  HGB 15.6 14.2  HCT 46.0 44.0  MCV  --  87.1  PLT  --  248   Cardiac Enzymes: No results for input(s): "CKTOTAL", "CKMB", "CKMBINDEX", "TROPONINI" in the last 168 hours. BNP: Invalid input(s): "POCBNP" CBG: Recent Labs  Lab 03/24/23 0402 03/24/23 1155 03/24/23 1634 03/24/23 2053 03/25/23 0556  GLUCAP 127* 336* 252* 161* 259*   D-Dimer No results for input(s): "DDIMER" in the last 72 hours. Hgb A1c Recent Labs    03/22/23 2037  HGBA1C 12.5*   Lipid Profile No results for input(s): "CHOL", "HDL", "LDLCALC", "TRIG", "CHOLHDL", "LDLDIRECT" in the last 72 hours. Thyroid function studies No results for input(s): "TSH", "T4TOTAL", "T3FREE", "THYROIDAB" in the last 72 hours.  Invalid input(s): "FREET3" Anemia work up No results for input(s): "VITAMINB12", "FOLATE", "FERRITIN", "TIBC", "IRON", "RETICCTPCT" in the last 72 hours. Urinalysis    Component Value Date/Time   COLORURINE YELLOW 03/23/2023 0953   APPEARANCEUR CLEAR 03/23/2023 0953   LABSPEC 1.025 03/23/2023 0953   PHURINE 6.0 03/23/2023 0953   GLUCOSEU >=500 (A) 03/23/2023 0953   HGBUR NEGATIVE 03/23/2023 0953   BILIRUBINUR NEGATIVE 03/23/2023 0953   KETONESUR NEGATIVE 03/23/2023 0953   PROTEINUR NEGATIVE 03/23/2023 0953   UROBILINOGEN 1 11/04/2013 1235   NITRITE NEGATIVE 03/23/2023 0953   LEUKOCYTESUR NEGATIVE 03/23/2023 0953   Sepsis Labs Recent Labs  Lab 03/23/23 0606  WBC 10.7*   Microbiology Recent Results (from the past 240 hour(s))  MRSA Next Gen by PCR, Nasal     Status: None   Collection Time: 03/23/23  5:36 AM   Specimen: Nasal Mucosa; Nasal Swab  Result Value Ref Range Status   MRSA by PCR Next Gen NOT DETECTED NOT DETECTED Final    Comment: (NOTE) The GeneXpert MRSA Assay (FDA approved  for NASAL specimens only), is one component of a comprehensive MRSA colonization surveillance program. It is not intended to diagnose MRSA infection nor to guide or monitor treatment for MRSA infections. Test performance is not FDA approved in patients less than 21 years old.  Performed at [facility], [address] [place], [place] [address]     Please note: You were cared for by a hospitalist during your hospital stay. Once you are discharged, your primary care physician will handle any further medical issues. Please note that NO REFILLS for any discharge medications will be authorized once you are discharged, as it is imperative that you return to your primary care physician (or establish a relationship with a primary care physician if you do not have one) for your post hospital discharge needs so that they can reassess your need for medications and monitor your lab values.    Time coordinating discharge: 40 minutes  SIGNED:   Burnadette Pop, MD  [facility] 03/25/2023, 10:39 AM Pager [phone]  If 7PM-7AM, please contact night-coverage [email or web address] Password [email or web address]

## 2023-03-26 ENCOUNTER — Telehealth: Payer: Self-pay

## 2023-03-26 LAB — RPR: RPR Ser Ql: NONREACTIVE

## 2023-03-26 NOTE — Transitions of Care (Post Inpatient/ED Visit) (Signed)
   03/26/2023  Name: BRACKSTON MARCIANTE MRN: [id number] DOB: 12-02-1960  Today's TOC FU Call Status: Today's TOC FU Call Status:: Unsuccessul Call (1st Attempt) Unsuccessful Call (1st Attempt) Date: 03/26/23  Attempted to reach the patient regarding the most recent Inpatient/ED visit.  Follow Up Plan: Additional outreach attempts will be made to reach the patient to complete the Transitions of Care (Post Inpatient/ED visit) call.   Abelino Derrick, MHA [facility]  Managed [facility] Social Worker [phone]

## 2023-03-27 ENCOUNTER — Telehealth: Payer: Self-pay

## 2023-03-27 NOTE — Transitions of Care (Post Inpatient/ED Visit) (Signed)
   03/27/2023  Name: PRISH TRIMPER MRN: [id number] DOB: 03-Nov-1960  Today's TOC FU Call Status: Today's TOC FU Call Status:: Unsuccessful Call (2nd Attempt) Unsuccessful Call (2nd Attempt) Date: 03/27/23  Attempted to reach the patient regarding the most recent Inpatient/ED visit.  Follow Up Plan: Additional outreach attempts will be made to reach the patient to complete the Transitions of Care (Post Inpatient/ED visit) call.   Abelino Derrick, MHA [facility]  Managed [facility] Social Worker [phone]

## 2023-04-05 ENCOUNTER — Ambulatory Visit (HOSPITAL_COMMUNITY): Payer: No Typology Code available for payment source

## 2023-04-05 ENCOUNTER — Telehealth (INDEPENDENT_AMBULATORY_CARE_PROVIDER_SITE_OTHER): Payer: Self-pay | Admitting: Primary Care

## 2023-04-05 NOTE — Telephone Encounter (Signed)
Tried to reach out to pt but phone numbers listed are not working t this time.

## 2023-04-09 ENCOUNTER — Ambulatory Visit (INDEPENDENT_AMBULATORY_CARE_PROVIDER_SITE_OTHER): Payer: No Typology Code available for payment source | Admitting: Primary Care

## 2023-04-09 DIAGNOSIS — Z419 Encounter for procedure for purposes other than remedying health state, unspecified: Secondary | ICD-10-CM | POA: Diagnosis not present

## 2023-04-18 ENCOUNTER — Telehealth (INDEPENDENT_AMBULATORY_CARE_PROVIDER_SITE_OTHER): Payer: Self-pay | Admitting: Primary Care

## 2023-04-18 NOTE — Telephone Encounter (Signed)
Called. Pt was unavailable

## 2023-04-19 ENCOUNTER — Inpatient Hospital Stay (INDEPENDENT_AMBULATORY_CARE_PROVIDER_SITE_OTHER): Payer: No Typology Code available for payment source | Admitting: Primary Care

## 2023-04-19 ENCOUNTER — Ambulatory Visit (INDEPENDENT_AMBULATORY_CARE_PROVIDER_SITE_OTHER): Payer: No Typology Code available for payment source | Admitting: Primary Care

## 2023-04-19 ENCOUNTER — Encounter (INDEPENDENT_AMBULATORY_CARE_PROVIDER_SITE_OTHER): Payer: Self-pay | Admitting: Primary Care

## 2023-04-19 VITALS — BP 148/98 | HR 100 | Resp 16 | Ht 69.0 in | Wt 126.0 lb

## 2023-04-19 DIAGNOSIS — Z794 Long term (current) use of insulin: Secondary | ICD-10-CM | POA: Diagnosis not present

## 2023-04-19 DIAGNOSIS — E1140 Type 2 diabetes mellitus with diabetic neuropathy, unspecified: Secondary | ICD-10-CM | POA: Diagnosis not present

## 2023-04-19 DIAGNOSIS — Z09 Encounter for follow-up examination after completed treatment for conditions other than malignant neoplasm: Secondary | ICD-10-CM | POA: Diagnosis not present

## 2023-04-19 LAB — POCT URINALYSIS DIP (CLINITEK)
Bilirubin, UA: NEGATIVE
Glucose, UA: >=1000 mg/dL — AB
Ketones, POC UA: NEGATIVE mg/dL
Leukocytes, UA: NEGATIVE
Nitrite, UA: NEGATIVE
POC PROTEIN,UA: 30 — AB
Spec Grav, UA: 1.02 (ref 1.010–1.025)
Urobilinogen, UA: 0.2 E.U./dL
pH, UA: 5.5 (ref 5.0–8.0)

## 2023-04-19 LAB — GLUCOSE, POCT (MANUAL RESULT ENTRY): POC Glucose: 393 mg/dl — AB (ref 70–99)

## 2023-04-19 MED ORDER — INSULIN ASPART 100 UNIT/ML IJ SOLN
10.0000 [IU] | Freq: Once | INTRAMUSCULAR | Status: AC
Start: 2023-04-19 — End: 2023-04-19
  Administered 2023-04-19: 10 [IU] via SUBCUTANEOUS

## 2023-04-19 NOTE — Progress Notes (Signed)
  [facility]   Subjective:   Allen Hernandez is a 62 y.o. male presents for hospital follow up and establish care. Admit date to the hospital was 03/22/23, patient was discharged from the hospital on 03/25/23, patient was admitted for: Opioid overdose  Patient girl friend receive a call her brother died left abruptly agreed to send 30 day supply and return visit   Past Medical History:  Diagnosis Date   Diabetes (HCC)    recently diagnosed - 3 months ago   Neuropathy due to secondary diabetes (HCC) 08/2014   Pneumonia    Renal disorder    Respiratory failure (HCC)      No Known Allergies    Current Outpatient Medications on File Prior to Visit  Medication Sig Dispense Refill   albuterol (VENTOLIN HFA) 108 (90 Base) MCG/ACT inhaler Inhale 2 puffs into the lungs every 6 (six) hours as needed for wheezing or shortness of breath. 8 g 2   Blood Glucose Monitoring Suppl DEVI 1 each by Does not apply route in the morning, at noon, and at bedtime. May substitute to any manufacturer covered by patient's insurance. 1 each 0   gabapentin (NEURONTIN) 600 MG tablet Take 1 tablet (600 mg total) by mouth 2 (two) times daily. 60 tablet 1   Glucose Blood (BLOOD GLUCOSE TEST STRIPS) STRP 1 each by In Vitro route in the morning, at noon, and at bedtime. May substitute to any manufacturer covered by patient's insurance. 100 strip 0   insulin glargine (LANTUS) 100 UNIT/ML Solostar Pen Inject 10 Units into the skin daily. 15 mL 0   Insulin Pen Needle (PEN NEEDLES) 32G X 5 MM MISC 1 each by Does not apply route 2 (two) times daily. 100 each 0   Lancet Device MISC 1 each by Does not apply route in the morning, at noon, and at bedtime. May substitute to any manufacturer covered by patient's insurance. 1 each 0   Lancets Misc. MISC 1 each by Does not apply route in the morning, at noon, and at bedtime. May substitute to any manufacturer covered by patient's insurance. 100 each 0   nicotine  (NICODERM CQ - DOSED IN MG/24 HOURS) 14 mg/24hr patch Place 1 patch (14 mg total) onto the skin daily. 28 patch 0   omeprazole (PRILOSEC) 20 MG capsule Take 20 mg by mouth daily.     No current facility-administered medications on file prior to visit.     Review of System: ROS  Objective:  There were no vitals taken for this visit.  There were no vitals filed for this visit.  Physical Exam:    Assessment:   1. Type 2 diabetes mellitus with diabetic neuropathy, unspecified whether long term insulin use (HCC)     No orders of the defined types were placed in this encounter.   This note has been created with Education officer, environmental. Any transcriptional errors are unintentional.   Grayce Sessions, NP 04/19/2023, 2:50 PM

## 2023-04-20 LAB — MICROALBUMIN / CREATININE URINE RATIO
Creatinine, Urine: 110.5 mg/dL
Microalb/Creat Ratio: 115 mg/g creat — ABNORMAL HIGH (ref 0–29)
Microalbumin, Urine: 126.6 ug/mL

## 2023-04-23 MED ORDER — INSULIN GLARGINE 100 UNIT/ML SOLOSTAR PEN
10.0000 [IU] | PEN_INJECTOR | Freq: Every day | SUBCUTANEOUS | 0 refills | Status: DC
Start: 2023-04-23 — End: 2023-05-28
  Filled 2023-04-23 – 2023-04-30 (×2): qty 15, 150d supply, fill #0

## 2023-04-24 ENCOUNTER — Other Ambulatory Visit: Payer: Self-pay

## 2023-04-30 ENCOUNTER — Other Ambulatory Visit: Payer: Self-pay

## 2023-05-10 DIAGNOSIS — Z419 Encounter for procedure for purposes other than remedying health state, unspecified: Secondary | ICD-10-CM | POA: Diagnosis not present

## 2023-05-18 ENCOUNTER — Telehealth (INDEPENDENT_AMBULATORY_CARE_PROVIDER_SITE_OTHER): Payer: Self-pay | Admitting: Primary Care

## 2023-05-18 NOTE — Telephone Encounter (Signed)
Pt was unavailable.

## 2023-05-21 ENCOUNTER — Ambulatory Visit (INDEPENDENT_AMBULATORY_CARE_PROVIDER_SITE_OTHER): Payer: No Typology Code available for payment source | Admitting: Primary Care

## 2023-05-28 ENCOUNTER — Other Ambulatory Visit (INDEPENDENT_AMBULATORY_CARE_PROVIDER_SITE_OTHER): Payer: Self-pay | Admitting: Primary Care

## 2023-05-28 DIAGNOSIS — E1140 Type 2 diabetes mellitus with diabetic neuropathy, unspecified: Secondary | ICD-10-CM

## 2023-05-28 NOTE — Telephone Encounter (Signed)
Medication Refill - Medication:   insulin glargine (LANTUS) 100 UNIT/ML Solostar Pen gabapentin (NEURONTIN) 600 MG tablet albuterol (VENTOLIN HFA) 108 (90 Base) MCG/ACT inhaler   Pt has been out of medication for 3 days.   Has the patient contacted their pharmacy? No.  Preferred Pharmacy (with phone number or street name): [facility] - [place], [place] - [address] Phone: [phone]  Fax: [phone]   Has the patient been seen for an appointment in the last year OR does the patient have an upcoming appointment? Yes.    Agent: Please be advised that RX refills may take up to 3 business days. We ask that you follow-up with your pharmacy.

## 2023-05-30 ENCOUNTER — Telehealth (INDEPENDENT_AMBULATORY_CARE_PROVIDER_SITE_OTHER): Payer: Self-pay | Admitting: Primary Care

## 2023-05-30 ENCOUNTER — Other Ambulatory Visit: Payer: Self-pay

## 2023-05-30 MED ORDER — INSULIN GLARGINE 100 UNIT/ML SOLOSTAR PEN
10.0000 [IU] | PEN_INJECTOR | Freq: Every day | SUBCUTANEOUS | 0 refills | Status: DC
Start: 2023-05-30 — End: 2023-06-01

## 2023-05-30 NOTE — Telephone Encounter (Signed)
error 

## 2023-05-30 NOTE — Telephone Encounter (Signed)
Requested medication (s) are due for refill today: routing for review  Requested medication (s) are on the active medication list: yes  Last refill:  03/25/23  Future visit scheduled: yes  Notes to clinic:  Unable to refill per protocol, last refill by ED provider. Routing for approval.     Requested Prescriptions  Pending Prescriptions Disp Refills   gabapentin (NEURONTIN) 600 MG tablet 60 tablet 1    Sig: Take 1 tablet (600 mg total) by mouth 2 (two) times daily.     Neurology: Anticonvulsants - gabapentin Passed - 05/28/2023 11:15 AM      Passed - Cr in normal range and within 360 days    Creat  Date Value Ref Range Status  03/16/2014 0.85 0.50 - 1.35 mg/dL Final   Creatinine, Ser  Date Value Ref Range Status  03/23/2023 0.96 0.61 - 1.24 mg/dL Final   Creatinine, Urine  Date Value Ref Range Status  02/19/2015 167.68 >20.0 mg/dL Final         Passed - Completed PHQ-2 or PHQ-9 in the last 360 days      Passed - Valid encounter within last 12 months    Recent Outpatient Visits           1 month ago Type 2 diabetes mellitus with diabetic neuropathy, unspecified whether long term insulin use (HCC)   [place] [facility] Grayce Sessions, NP   8 years ago Scoliosis (and kyphoscoliosis), idiopathic   [place] [place], [place] E, MD   8 years ago Type 2 diabetes mellitus without complication   [facility] [place], [place] E, MD   8 years ago Type 2 diabetes mellitus without complication   [place] [facility] Quentin Angst, MD   9 years ago Back pain   [place] [facility] Dorothea Ogle, MD       Future Appointments             Tomorrow Grayce Sessions, NP [place] [facility]             albuterol (VENTOLIN HFA) 108 (90 Base) MCG/ACT inhaler 8 g 2    Sig: Inhale 2 puffs into the  lungs every 6 (six) hours as needed for wheezing or shortness of breath.     Pulmonology:  Beta Agonists 2 Failed - 05/28/2023 11:15 AM      Failed - Last BP in normal range    BP Readings from Last 1 Encounters:  04/19/23 (!) 148/98         Passed - Last Heart Rate in normal range    Pulse Readings from Last 1 Encounters:  04/19/23 100         Passed - Valid encounter within last 12 months    Recent Outpatient Visits           1 month ago Type 2 diabetes mellitus with diabetic neuropathy, unspecified whether long term insulin use (HCC)   [place] [facility] Grayce Sessions, NP   8 years ago Scoliosis (and kyphoscoliosis), idiopathic   [place] [facility] [place], [place] E, MD   8 years ago Type 2 diabetes mellitus without complication   [place] [facility] [place], [place] E, MD   8 years ago Type 2 diabetes mellitus without complication   [facility] [place], Phylliss Blakes, MD   9 years ago Back pain   [place] [facility] Dorothea Ogle, MD       Future Appointments             Tomorrow Grayce Sessions, NP [place] [facility]            Signed Prescriptions Disp Refills   insulin glargine (LANTUS) 100 UNIT/ML Solostar Pen 15 mL 0    Sig: Inject 10 Units into the skin daily.     Endocrinology:  Diabetes - Insulins Failed - 05/28/2023 11:15 AM      Failed - HBA1C is between 0 and 7.9 and within 180 days    Hgb A1c MFr Bld  Date Value Ref Range Status  03/22/2023 12.5 (H) 4.8 - 5.6 % Final    Comment:    (NOTE) Pre diabetes:          5.7%-6.4%  Diabetes:              >6.4%  Glycemic control for   <7.0% adults with diabetes          Passed - Valid encounter within last 6 months    Recent Outpatient Visits           1 month ago Type 2 diabetes mellitus with diabetic neuropathy, unspecified  whether long term insulin use (HCC)   [place] [facility] Grayce Sessions, NP   8 years ago Scoliosis (and kyphoscoliosis), idiopathic   [place] [facility] [place], [place] E, MD   8 years ago Type 2 diabetes mellitus without complication   [place] [facility] [employer] [place], MD   8 years ago Type 2 diabetes mellitus without complication   [place] [facility] Quentin Angst, MD   9 years ago Back pain   [place] [facility] [place], Trevor Iha, MD       Future Appointments             Tomorrow Grayce Sessions, NP  [facility]

## 2023-05-30 NOTE — Telephone Encounter (Signed)
Requested Prescriptions  Pending Prescriptions Disp Refills   insulin glargine (LANTUS) 100 UNIT/ML Solostar Pen 15 mL 0    Sig: Inject 10 Units into the skin daily.     Endocrinology:  Diabetes - Insulins Failed - 05/28/2023 11:15 AM      Failed - HBA1C is between 0 and 7.9 and within 180 days    Hgb A1c MFr Bld  Date Value Ref Range Status  03/22/2023 12.5 (H) 4.8 - 5.6 % Final    Comment:    (NOTE) Pre diabetes:          5.7%-6.4%  Diabetes:              >6.4%  Glycemic control for   <7.0% adults with diabetes          Passed - Valid encounter within last 6 months    Recent Outpatient Visits           1 month ago Type 2 diabetes mellitus with diabetic neuropathy, unspecified whether long term insulin use (HCC)   [place] [facility] Grayce Sessions, NP   8 years ago Scoliosis (and kyphoscoliosis), idiopathic   [place] [facility] [place], [place] E, MD   8 years ago Type 2 diabetes mellitus without complication   [place] [facility] [place], [place] E, MD   8 years ago Type 2 diabetes mellitus without complication   [place] [facility] Quentin Angst, MD   9 years ago Back pain   [place] [facility] Dorothea Ogle, MD       Future Appointments             Tomorrow Grayce Sessions, NP [place] [facility]             gabapentin (NEURONTIN) 600 MG tablet 60 tablet 1    Sig: Take 1 tablet (600 mg total) by mouth 2 (two) times daily.     Neurology: Anticonvulsants - gabapentin Passed - 05/28/2023 11:15 AM      Passed - Cr in normal range and within 360 days    Creat  Date Value Ref Range Status  03/16/2014 0.85 0.50 - 1.35 mg/dL Final   Creatinine, Ser  Date Value Ref Range Status  03/23/2023 0.96 0.61 - 1.24 mg/dL Final   Creatinine, Urine  Date Value Ref Range Status  02/19/2015 167.68 >20.0  mg/dL Final         Passed - Completed PHQ-2 or PHQ-9 in the last 360 days      Passed - Valid encounter within last 12 months    Recent Outpatient Visits           1 month ago Type 2 diabetes mellitus with diabetic neuropathy, unspecified whether long term insulin use (HCC)   [place] [facility] Grayce Sessions, NP   8 years ago Scoliosis (and kyphoscoliosis), idiopathic   [place] [facility] [place], [place] E, MD   8 years ago Type 2 diabetes mellitus without complication   [place] [facility] [place], [place] E, MD   8 years ago Type 2 diabetes mellitus without complication   [place] [facility] Quentin Angst, MD   9 years ago Back pain   [place] [facility] Dorothea Ogle, MD       Future  Appointments             Tomorrow Grayce Sessions, NP [place] [facility]             albuterol (VENTOLIN HFA) 108 (90 Base) MCG/ACT inhaler 8 g 2    Sig: Inhale 2 puffs into the lungs every 6 (six) hours as needed for wheezing or shortness of breath.     Pulmonology:  Beta Agonists 2 Failed - 05/28/2023 11:15 AM      Failed - Last BP in normal range    BP Readings from Last 1 Encounters:  04/19/23 (!) 148/98         Passed - Last Heart Rate in normal range    Pulse Readings from Last 1 Encounters:  04/19/23 100         Passed - Valid encounter within last 12 months    Recent Outpatient Visits           1 month ago Type 2 diabetes mellitus with diabetic neuropathy, unspecified whether long term insulin use (HCC)   [place] [facility] Grayce Sessions, NP   8 years ago Scoliosis (and kyphoscoliosis), idiopathic   [place] [facility] [place], [place] E, MD   8 years ago Type 2 diabetes mellitus without complication   [place] [employer] [facility] [place], [place] E, MD   8 years ago Type 2 diabetes mellitus without complication   [place] [facility] Quentin Angst, MD   9 years ago Back pain   [place] [facility] [place], Trevor Iha, MD       Future Appointments             Tomorrow Grayce Sessions, NP [place] [facility]

## 2023-05-31 ENCOUNTER — Ambulatory Visit (INDEPENDENT_AMBULATORY_CARE_PROVIDER_SITE_OTHER): Payer: No Typology Code available for payment source | Admitting: Primary Care

## 2023-06-01 ENCOUNTER — Other Ambulatory Visit (INDEPENDENT_AMBULATORY_CARE_PROVIDER_SITE_OTHER): Payer: Self-pay | Admitting: Primary Care

## 2023-06-01 ENCOUNTER — Telehealth (INDEPENDENT_AMBULATORY_CARE_PROVIDER_SITE_OTHER): Payer: Self-pay | Admitting: Primary Care

## 2023-06-01 ENCOUNTER — Other Ambulatory Visit: Payer: Self-pay

## 2023-06-01 ENCOUNTER — Other Ambulatory Visit: Payer: Self-pay | Admitting: Pharmacist

## 2023-06-01 MED ORDER — BASAGLAR KWIKPEN 100 UNIT/ML ~~LOC~~ SOPN: 10.0000 [IU] | PEN_INJECTOR | Freq: Every day | SUBCUTANEOUS | 2 refills | Status: DC

## 2023-06-01 NOTE — Telephone Encounter (Signed)
Requested medications are due for refill today.  yes  Requested medications are on the active medications list.  yes  Last refill. 03/25/2023  60 day supply  Future visit scheduled.   yes  Notes to clinic.  Rxs are signed by Burnadette Pop.    Requested Prescriptions  Pending Prescriptions Disp Refills   gabapentin (NEURONTIN) 600 MG tablet 60 tablet 1    Sig: Take 1 tablet (600 mg total) by mouth 2 (two) times daily.     Neurology: Anticonvulsants - gabapentin Passed - 06/01/2023 12:18 PM      Passed - Cr in normal range and within 360 days    Creat  Date Value Ref Range Status  03/16/2014 0.85 0.50 - 1.35 mg/dL Final   Creatinine, Ser  Date Value Ref Range Status  03/23/2023 0.96 0.61 - 1.24 mg/dL Final   Creatinine, Urine  Date Value Ref Range Status  02/19/2015 167.68 >20.0 mg/dL Final         Passed - Completed PHQ-2 or PHQ-9 in the last 360 days      Passed - Valid encounter within last 12 months    Recent Outpatient Visits           1 month ago Type 2 diabetes mellitus with diabetic neuropathy, unspecified whether long term insulin use (HCC)   [place] [facility] Grayce Sessions, NP   8 years ago Scoliosis (and kyphoscoliosis), idiopathic   [place] [facility] [place], [place] E, MD   8 years ago Type 2 diabetes mellitus without complication   [place] [facility] [place], [place] E, MD   8 years ago Type 2 diabetes mellitus without complication   [place] [facility] Quentin Angst, MD   9 years ago Back pain   [place] [facility] [place], Trevor Iha, MD       Future Appointments             In 5 days Grayce Sessions, NP [place] [facility]             albuterol (VENTOLIN HFA) 108 (90 Base) MCG/ACT inhaler 8 g 2    Sig: Inhale 2 puffs into the lungs every 6 (six) hours as needed for wheezing  or shortness of breath.     Pulmonology:  Beta Agonists 2 Failed - 06/01/2023 12:18 PM      Failed - Last BP in normal range    BP Readings from Last 1 Encounters:  04/19/23 (!) 148/98         Passed - Last Heart Rate in normal range    Pulse Readings from Last 1 Encounters:  04/19/23 100         Passed - Valid encounter within last 12 months    Recent Outpatient Visits           1 month ago Type 2 diabetes mellitus with diabetic neuropathy, unspecified whether long term insulin use (HCC)   [place] [facility] Grayce Sessions, NP   8 years ago Scoliosis (and kyphoscoliosis), idiopathic   [place] [facility] [place], [place] E, MD   8 years ago Type 2 diabetes mellitus without complication   [place] [facility] [place], [place] E, MD   8 years ago Type 2 diabetes mellitus without complication   [place] [facility] [place],  Phylliss Blakes, MD   9 years ago Back pain   [place] [facility] Dorothea Ogle, MD       Future Appointments             In 5 days Randa Evens, Kinnie Scales, NP [place] [facility]

## 2023-06-01 NOTE — Telephone Encounter (Signed)
Medication Refill - Medication: gabapentin (NEURONTIN) 600 MG tablet, albuterol (VENTOLIN HFA) 108 (90 Base) MCG/ACT inhaler   Has the patient contacted their pharmacy? Yes.    (Agent: If yes, when and what did the pharmacy advise?)  Preferred Pharmacy (with phone number or street name):  [facility] - [place], [place] - [address]  [address] [place] [address]  Phone: [phone] Fax: [phone]  Hours: Not open 24 hours   Has the patient been seen for an appointment in the last year OR does the patient have an upcoming appointment? Yes.    Agent: Please be advised that RX refills may take up to 3 business days. We ask that you follow-up with your pharmacy.

## 2023-06-01 NOTE — Telephone Encounter (Signed)
Burna Mortimer pt friend stated was advised by the pharmacy that a PA is needed for insulin glargine (LANTUS) 100 UNIT/ML Solostar Pen.  Pt has been out of insulin for about 4-5 days.  Please advise.

## 2023-06-01 NOTE — Telephone Encounter (Signed)
Before returning call looked at pt DPR and Allen Hernandez is not on it. Tried contacting pt. Pt didn't answer and was unable to lvm

## 2023-06-04 ENCOUNTER — Ambulatory Visit (INDEPENDENT_AMBULATORY_CARE_PROVIDER_SITE_OTHER): Payer: No Typology Code available for payment source | Admitting: Primary Care

## 2023-06-06 ENCOUNTER — Ambulatory Visit (INDEPENDENT_AMBULATORY_CARE_PROVIDER_SITE_OTHER): Payer: No Typology Code available for payment source | Admitting: Primary Care

## 2023-06-07 ENCOUNTER — Telehealth (INDEPENDENT_AMBULATORY_CARE_PROVIDER_SITE_OTHER): Payer: Self-pay | Admitting: Primary Care

## 2023-06-07 NOTE — Telephone Encounter (Signed)
Medication Refill - Medication: Accucheck test strips  Has the patient contacted their pharmacy? No. (Agent: If no, request that the patient contact the pharmacy for the refill. If patient does not wish to contact the pharmacy document the reason why and proceed with request.) (Agent: If yes, when and what did the pharmacy advise?)  Preferred Pharmacy (with phone number or street name):  [facility] - [place], [place] - [address] Phone: [phone]  Fax: [phone]     Has the patient been seen for an appointment in the last year OR does the patient have an upcoming appointment? Yes.    Agent: Please be advised that RX refills may take up to 3 business days. We ask that you follow-up with your pharmacy.

## 2023-06-10 DIAGNOSIS — Z419 Encounter for procedure for purposes other than remedying health state, unspecified: Secondary | ICD-10-CM | POA: Diagnosis not present

## 2023-06-14 ENCOUNTER — Ambulatory Visit (INDEPENDENT_AMBULATORY_CARE_PROVIDER_SITE_OTHER): Payer: No Typology Code available for payment source | Admitting: Primary Care

## 2023-06-14 ENCOUNTER — Other Ambulatory Visit: Payer: Self-pay

## 2023-06-14 VITALS — BP 150/82 | HR 99 | Resp 16 | Ht 69.0 in | Wt 131.6 lb

## 2023-06-14 DIAGNOSIS — Z760 Encounter for issue of repeat prescription: Secondary | ICD-10-CM

## 2023-06-14 DIAGNOSIS — Z122 Encounter for screening for malignant neoplasm of respiratory organs: Secondary | ICD-10-CM | POA: Diagnosis not present

## 2023-06-14 DIAGNOSIS — I10 Essential (primary) hypertension: Secondary | ICD-10-CM | POA: Diagnosis not present

## 2023-06-14 DIAGNOSIS — Z794 Long term (current) use of insulin: Secondary | ICD-10-CM | POA: Diagnosis not present

## 2023-06-14 DIAGNOSIS — E1140 Type 2 diabetes mellitus with diabetic neuropathy, unspecified: Secondary | ICD-10-CM | POA: Diagnosis not present

## 2023-06-14 LAB — GLUCOSE, POCT (MANUAL RESULT ENTRY): POC Glucose: 323 mg/dL — AB (ref 70–99)

## 2023-06-14 MED ORDER — HYDROCHLOROTHIAZIDE 12.5 MG PO TABS
25.0000 mg | ORAL_TABLET | Freq: Every day | ORAL | 0 refills | Status: DC
  Filled 2023-06-14: qty 90, 45d supply, fill #0

## 2023-06-14 MED ORDER — INSULIN REGULAR HUMAN 100 UNIT/ML IJ SOLN: INTRAMUSCULAR | 3 refills | Status: DC

## 2023-06-14 MED ORDER — LOSARTAN POTASSIUM 25 MG PO TABS
25.0000 mg | ORAL_TABLET | Freq: Every day | ORAL | 0 refills | Status: DC
  Filled 2023-06-14: qty 90, 90d supply, fill #0

## 2023-06-14 MED ORDER — GABAPENTIN 600 MG PO TABS: 600.0000 mg | ORAL_TABLET | Freq: Two times a day (BID) | ORAL | 1 refills | Status: DC

## 2023-06-14 MED ORDER — GLIPIZIDE 10 MG PO TABS
10.0000 mg | ORAL_TABLET | Freq: Two times a day (BID) | ORAL | 3 refills | Status: DC
Start: 2023-06-14 — End: 2023-09-19

## 2023-06-14 MED ORDER — INSULIN GLARGINE 100 UNITS/ML SOLOSTAR PEN: 15.0000 [IU] | PEN_INJECTOR | Freq: Every day | SUBCUTANEOUS | 3 refills | Status: DC

## 2023-06-14 MED ORDER — HYDROCHLOROTHIAZIDE 12.5 MG PO TABS: 25.0000 mg | ORAL_TABLET | Freq: Every day | ORAL | 0 refills | Status: DC

## 2023-06-14 MED ORDER — PEN NEEDLES 32G X 5 MM MISC: 1.0000 | Freq: Two times a day (BID) | 0 refills | Status: DC

## 2023-06-14 MED ORDER — GLIPIZIDE 10 MG PO TABS
10.0000 mg | ORAL_TABLET | Freq: Two times a day (BID) | ORAL | 3 refills | Status: DC
  Filled 2023-06-14: qty 60, 30d supply, fill #0

## 2023-06-14 MED ORDER — LOSARTAN POTASSIUM 25 MG PO TABS: 25.0000 mg | ORAL_TABLET | Freq: Every day | ORAL | 0 refills | Status: DC

## 2023-06-14 MED ORDER — INSULIN ASPART 100 UNIT/ML IJ SOLN
10.0000 [IU] | Freq: Once | INTRAMUSCULAR | Status: AC
Start: 2023-06-14 — End: ?

## 2023-06-14 MED ORDER — INSULIN GLARGINE 100 UNITS/ML SOLOSTAR PEN
15.0000 [IU] | PEN_INJECTOR | Freq: Every day | SUBCUTANEOUS | 3 refills | Status: DC
  Filled 2023-06-14: qty 15, 100d supply, fill #0

## 2023-06-14 MED ORDER — INSULIN REGULAR HUMAN 100 UNIT/ML IJ SOLN
INTRAMUSCULAR | 3 refills | Status: DC
  Filled 2023-06-14: qty 10, fill #0

## 2023-06-14 NOTE — Progress Notes (Signed)
[facility]  Allen Hernandez, is a 62 y.o. male  XBJ:[id number]  HYQ:[id number]  DOB - 03-31-1961  Chief Complaint  Patient presents with   Diabetes       Subjective:   Allen Hernandez is a 62 y.o. male here today for a follow up visit. His first appt he left due to an emergency . Therefore only gave him 30 days of medication. BS checked at home readings state high. Today his CBG was > 300 and given 10 units of insulin, Denies polyuria, polydipsia, polyphasia or vision changes.  Does not check blood sugars at home. Bp is elevated -Patient has No headache, No chest pain, No abdominal pain - No Nausea, No new weakness tingling or numbness, No Cough - shortness of breath  No problems updated.  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 8 g 2   omeprazole (PRILOSEC) 20 MG capsule Take 20 mg by mouth daily.     Blood Glucose Monitoring Suppl DEVI 1 each by Does not apply route in the morning, at noon, and at bedtime. May substitute to any manufacturer covered by patient's insurance. 1 each 0   nicotine (NICODERM CQ - DOSED IN MG/24 HOURS) 14 mg/24hr patch Place 1 patch (14 mg total) onto the skin daily. (Patient not taking: Reported on 06/14/2023) 28 patch 0   No current facility-administered medications on file prior to visit.    Objective:   Vitals:   06/14/23 1434 06/14/23 1514  BP: (!) 161/96 (!) 150/82  Pulse: 99   Resp: 16   SpO2: 98%   Weight: 131 lb 9.6 oz (59.7 kg)   Height: 5\' 9"  (1.753 m)     Comprehensive ROS Pertinent positive  and negative noted in HPI   Exam General appearance : Awake, alert, not in any distress. Speech Clear. Not toxic looking HEENT: Atraumatic and Normocephalic, pupils equally reactive to light and accomodation Neck: Supple, no JVD. No cervical lymphadenopathy.  Chest: Good air entry bilaterally, no added sounds  CVS: S1 S2 regular, no murmurs.  Abdomen: Bowel sounds present, Non tender and not distended with no gaurding, rigidity or rebound. Extremities: B/L Lower Ext shows no edema, both legs are warm to touch Neurology: Awake alert, and oriented X 3, CN II-XII intact, Non focal Skin: No Rash  Data Review Lab Results  Component Value Date   HGBA1C 12.5 (H) 03/22/2023   HGBA1C 8.2 (H) 02/07/2018   HGBA1C 7.60 10/27/2014    Assessment & Plan  Rosemary was seen today for diabetes.  Diagnoses and all orders for this visit:  Type 2 diabetes mellitus with diabetic neuropathy, with long-term current use of insulin (HCC) A1c 2 months ago was 12.3  Complications from uncontrolled diabetes -diabetic retinopathy leading to blindness, diabetic nephropathy leading to dialysis, decrease in circulation decrease in sores or wound healing which may lead to amputations and increase of heart attack and stroke POCT glucose (manual entry) -     HCV Ab w Reflex to Quant PCR -  insulin aspart (novoLOG) injection 10 Units -     Ambulatory referral to Ophthalmology -     glipiZIDE (GLUCOTROL) 10 MG tablet; Take 1 tablet (10 mg total) by mouth 2 (two) times daily before a meal. -     insulin glargine (LANTUS) 100 unit/mL SOPN; Inject 15 Units into the skin daily. -     Insulin Pen Needle (PEN NEEDLES) 32G X 5 MM MISC; 1 each by Does not apply route 2 (two) times daily. -     gabapentin (NEURONTIN) 600 MG tablet; Take 1 tablet (600 mg total) by mouth 2 (two) times daily. -     insulin regular (HUMULIN R) 100 units/mL injection; For blood sugars 200 -250 give 4 units of insulin, 251-300 give 6 units, 301-350  give 8 units, 351-400 give 10 units,> 400 give 12 units and call M.D. Discussed hypoglycemia protocol.  -     POCT glucose (manual entry) -     HCV Ab w Reflex to Quant PCR -     insulin aspart (novoLOG) injection 10 Units  Essential hypertension BP goal - < 130/80 Explained that having normal blood pressure is the goal and medications are helping to get to goal and maintain normal blood pressure. DIET: Limit salt intake, read nutrition labels to check salt content, limit fried and high fatty foods  Avoid using multisymptom OTC cold preparations that generally contain sudafed which can rise BP. Consult with pharmacist on best cold relief products to use for persons with HTN EXERCISE Discussed incorporating exercise such as walking - 30 minutes most days of the week and can do in 10 minute intervals      Medication refill -     glipiZIDE (GLUCOTROL) 10 MG tablet; Take 1 tablet (10 mg total) by mouth 2 (two) times daily before a meal. -     insulin glargine (LANTUS) 100 unit/mL SOPN; Inject 15 Units into the skin daily. -     gabapentin (NEURONTIN) 600 MG tablet; Take 1 tablet (600 mg total) by mouth 2 (two) times daily.  Other orders -     Discontinue: glipiZIDE (GLUCOTROL) 10 MG tablet; Take 1 tablet (10 mg total) by mouth 2 (two) times daily before a meal. -     Discontinue: insulin regular (HUMULIN R) 100 units/mL injection; For blood sugars 200 -250 give 4 units of insulin, 251-300 give 6 units, 301-350 give 8 units, 351-400 give 10 units,> 400 give 12 units and call M.D. Discussed hypoglycemia protocol. -     Discontinue: insulin glargine (LANTUS) 100 unit/mL SOPN; Inject 15 Units into the skin daily. -     Discontinue: losartan (COZAAR) 25 MG tablet; Take 1 tablet (25 mg total) by mouth daily. -     Discontinue: hydrochlorothiazide (HYDRODIURIL) 12.5 MG tablet; Take 2 tablets (25 mg total) by mouth daily. -     Discontinue: insulin regular (HUMULIN R) 100 units/mL injection; For  blood sugars 200 -250 give 4 units of insulin, 251-300 give 6 units, 301-350 give 8 units, 351-400 give 10 units,> 400 give 12 units and call M.D. Discussed hypoglycemia protocol.      Patient have been counseled extensively about nutrition and exercise. Other issues discussed during this visit include: low cholesterol diet, weight control and daily exercise, foot care, annual eye examinations at Ophthalmology, importance of adherence with medications and regular follow-up. We also discussed long term complications of uncontrolled diabetes and hypertension.   Return in about 3 months (around 09/13/2023).  The patient  was given clear instructions to go to ER or return to medical center if symptoms don't improve, worsen or new problems develop. The patient verbalized understanding. The patient was told to call to get lab results if they haven't heard anything in the next week.   This note has been created with Education officer, environmental. Any transcriptional errors are unintentional.   Grayce Sessions, NP 06/18/2023, 5:30 PM

## 2023-06-14 NOTE — Patient Instructions (Signed)

## 2023-06-15 ENCOUNTER — Other Ambulatory Visit (INDEPENDENT_AMBULATORY_CARE_PROVIDER_SITE_OTHER): Payer: Self-pay

## 2023-06-15 ENCOUNTER — Other Ambulatory Visit (INDEPENDENT_AMBULATORY_CARE_PROVIDER_SITE_OTHER): Payer: Self-pay | Admitting: Primary Care

## 2023-06-15 ENCOUNTER — Telehealth (INDEPENDENT_AMBULATORY_CARE_PROVIDER_SITE_OTHER): Payer: Self-pay

## 2023-06-15 MED ORDER — ACCU-CHEK GUIDE VI STRP: ORAL_STRIP | 12 refills | Status: AC

## 2023-06-15 NOTE — Telephone Encounter (Signed)
Sent request for test strips in a separate encounter to the office for the provider to approve.

## 2023-06-15 NOTE — Telephone Encounter (Signed)
Telephone Encounter [place], [place]  Encounter Date: 06/15/2023  Signed Medication Refill - Medication: Accu-Chek test strips   Has the patient contacted their pharmacy? Yes.   Pt told to contact provider   Preferred Pharmacy (with phone number or street name):  [facility] - [place], [place] - [address] Phone: [phone]  Fax: [phone]      Has the patient been seen for an appointment in the last year OR does the patient have an upcoming appointment? Yes.     Agent: Please be advised that RX refills may take up to 3 business days. We ask that you follow-up with your pharmacy.

## 2023-06-15 NOTE — Telephone Encounter (Signed)
done

## 2023-06-15 NOTE — Telephone Encounter (Signed)
Medication Refill - Medication: albuterol (VENTOLIN HFA) 108 (90 Base) MCG/ACT inhaler and Accu-Chek test strips  Has the patient contacted their pharmacy? Yes.   Pt told to contact provider  Preferred Pharmacy (with phone number or street name):  [facility] - [place], [place] - [address] Phone: [phone]  Fax: [phone]     Has the patient been seen for an appointment in the last year OR does the patient have an upcoming appointment? Yes.    Agent: Please be advised that RX refills may take up to 3 business days. We ask that you follow-up with your pharmacy.

## 2023-06-18 MED ORDER — ALBUTEROL SULFATE HFA 108 (90 BASE) MCG/ACT IN AERS: 2.0000 | INHALATION_SPRAY | Freq: Four times a day (QID) | RESPIRATORY_TRACT | 2 refills | Status: DC | PRN

## 2023-06-18 NOTE — Telephone Encounter (Signed)
Requested medication (s) are due for refill today: yes  Requested medication (s) are on the active medication list: yes  Last refill:  03/25/23  Future visit scheduled: yes  Notes to clinic:  Unable to refill per protocol, last refill by another provider. Routng to PCP for approval.     Requested Prescriptions  Pending Prescriptions Disp Refills   albuterol (VENTOLIN HFA) 108 (90 Base) MCG/ACT inhaler 8 g 2    Sig: Inhale 2 puffs into the lungs every 6 (six) hours as needed for wheezing or shortness of breath.     Pulmonology:  Beta Agonists 2 Failed - 06/15/2023  3:05 PM      Failed - Last BP in normal range    BP Readings from Last 1 Encounters:  06/14/23 (!) 150/82         Passed - Last Heart Rate in normal range    Pulse Readings from Last 1 Encounters:  06/14/23 99         Passed - Valid encounter within last 12 months    Recent Outpatient Visits           4 days ago Type 2 diabetes mellitus with diabetic neuropathy, with long-term current use of insulin (HCC)   [place] [facility] Grayce Sessions, NP   2 months ago Type 2 diabetes mellitus with diabetic neuropathy, unspecified whether long term insulin use (HCC)   [place] [facility] Grayce Sessions, NP   8 years ago Scoliosis (and kyphoscoliosis), idiopathic   [place] [facility] [place], [place] E, MD   8 years ago Type 2 diabetes mellitus without complication   [place] [facility] [place], [place] E, MD   8 years ago Type 2 diabetes mellitus without complication   [place] [facility] Quentin Angst, MD       Future Appointments             In 2 months Randa Evens, Kinnie Scales, NP  [facility]

## 2023-06-18 NOTE — Telephone Encounter (Signed)
Will forward to provider  

## 2023-06-29 ENCOUNTER — Telehealth (INDEPENDENT_AMBULATORY_CARE_PROVIDER_SITE_OTHER): Payer: Self-pay | Admitting: Primary Care

## 2023-06-29 ENCOUNTER — Ambulatory Visit (INDEPENDENT_AMBULATORY_CARE_PROVIDER_SITE_OTHER): Payer: Self-pay

## 2023-06-29 NOTE — Telephone Encounter (Signed)
Medication Refill - Medication: hydrochlorothiazide (HYDRODIURIL) 12.5 MG tablet  Pt fianc called requesting refill. Please advise.    Has the patient contacted their pharmacy? No. (Agent: If no, request that the patient contact the pharmacy for the refill. If patient does not wish to contact the pharmacy document the reason why and proceed with request.)   Preferred Pharmacy (with phone number or street name):  [facility] - [place], [place] - [address]  [address] [place] [address]  Phone: [phone] Fax: [phone]  Hours: Not open 24 hours   Has the patient been seen for an appointment in the last year OR does the patient have an upcoming appointment? Yes.    Agent: Please be advised that RX refills may take up to 3 business days. We ask that you follow-up with your pharmacy.

## 2023-06-29 NOTE — Telephone Encounter (Signed)
Patient's fiance' called, left VM to return the call to the office to speak to the NT.   Summary: med management.   Pt fiance called requesting a refill for medication hydrochlorothiazide (HYDRODIURIL) 12.5 MG tablet . She is asking what the medication is for.   Seeking clinical advice.

## 2023-06-29 NOTE — Telephone Encounter (Signed)
Patient called, no answer, mailbox is full. Allen Hernandez called and asked if patient is there to give me permission to speak to and she said he's asleep. I advised I'm not able to disclose any information, but she can call the pharmacy to see what the medications are for. She verbalized understanding. Advised the request is in and it could take up to 72 hours to refill.  Summary: med management.   Pt fiance called requesting a refill for medication hydrochlorothiazide (HYDRODIURIL) 12.5 MG tablet . She is asking what the medication is for.   Seeking clinical advice.

## 2023-06-29 NOTE — Telephone Encounter (Signed)
[facility] called and spoke to [place], Pensions consultant about refill request. Advised it was sent on 06/14/23 #90/0 refills. She says it was filled and picked up already.

## 2023-07-10 DIAGNOSIS — Z419 Encounter for procedure for purposes other than remedying health state, unspecified: Secondary | ICD-10-CM | POA: Diagnosis not present

## 2023-07-12 ENCOUNTER — Other Ambulatory Visit (INDEPENDENT_AMBULATORY_CARE_PROVIDER_SITE_OTHER): Payer: Self-pay | Admitting: Primary Care

## 2023-07-12 ENCOUNTER — Ambulatory Visit (INDEPENDENT_AMBULATORY_CARE_PROVIDER_SITE_OTHER): Payer: No Typology Code available for payment source

## 2023-07-12 DIAGNOSIS — Z760 Encounter for issue of repeat prescription: Secondary | ICD-10-CM

## 2023-07-12 DIAGNOSIS — E1140 Type 2 diabetes mellitus with diabetic neuropathy, unspecified: Secondary | ICD-10-CM

## 2023-07-13 NOTE — Telephone Encounter (Signed)
Requested Prescriptions  Refused Prescriptions Disp Refills   gabapentin (NEURONTIN) 600 MG tablet [Pharmacy Med Name: GABAPENTIN 600 MG ORAL TABLET] 60 tablet 1    Sig: TAKE 1 TABLET (600 MG TOTAL) BY MOUTH 2 (TWO) TIMES DAILY.     Neurology: Anticonvulsants - gabapentin Passed - 07/12/2023  2:54 PM      Passed - Cr in normal range and within 360 days    Creat  Date Value Ref Range Status  03/16/2014 0.85 0.50 - 1.35 mg/dL Final   Creatinine, Ser  Date Value Ref Range Status  03/23/2023 0.96 0.61 - 1.24 mg/dL Final   Creatinine, Urine  Date Value Ref Range Status  02/19/2015 167.68 >20.0 mg/dL Final         Passed - Completed PHQ-2 or PHQ-9 in the last 360 days      Passed - Valid encounter within last 12 months    Recent Outpatient Visits           4 weeks ago Type 2 diabetes mellitus with diabetic neuropathy, with long-term current use of insulin (HCC)   [place] [facility] Grayce Sessions, NP   2 months ago Type 2 diabetes mellitus with diabetic neuropathy, unspecified whether long term insulin use (HCC)   [place] [facility] Grayce Sessions, NP   8 years ago Scoliosis (and kyphoscoliosis), idiopathic   [place] [facility] [place], [place] E, MD   8 years ago Type 2 diabetes mellitus without complication   [place] [facility] [place], [place] E, MD   9 years ago Type 2 diabetes mellitus without complication   [place] [facility] Quentin Angst, MD       Future Appointments             In 2 months Randa Evens, Kinnie Scales, NP [place] [facility]

## 2023-07-25 ENCOUNTER — Other Ambulatory Visit (INDEPENDENT_AMBULATORY_CARE_PROVIDER_SITE_OTHER): Payer: Self-pay | Admitting: Primary Care

## 2023-07-25 DIAGNOSIS — E1140 Type 2 diabetes mellitus with diabetic neuropathy, unspecified: Secondary | ICD-10-CM

## 2023-07-25 MED ORDER — PEN NEEDLES 32G X 5 MM MISC
1.0000 | Freq: Two times a day (BID) | 0 refills | Status: DC
Start: 2023-07-25 — End: 2023-09-26

## 2023-07-25 NOTE — Telephone Encounter (Signed)
Medication Refill - Medication: Insulin Pen Needle (PEN NEEDLES) 32G X 5 MM MISC   Has the patient contacted their pharmacy? yes ((Agent: If yes, when and what did the pharmacy advise?)contact pcp  Preferred Pharmacy (with phone number or street name):  [facility] - [place], [place] - [address] Phone: [phone]  Fax: [phone]     Has the patient been seen for an appointment in the last year OR does the patient have an upcoming appointment? yes  Agent: Please be advised that RX refills may take up to 3 business days. We ask that you follow-up with your pharmacy.

## 2023-07-25 NOTE — Telephone Encounter (Signed)
Requested Prescriptions  Pending Prescriptions Disp Refills   Insulin Pen Needle (PEN NEEDLES) 32G X 5 MM MISC 200 each 0    Sig: 1 each by Does not apply route 2 (two) times daily.     Endocrinology: Diabetes - Testing Supplies Passed - 07/25/2023 10:45 AM      Passed - Valid encounter within last 12 months    Recent Outpatient Visits           1 month ago Type 2 diabetes mellitus with diabetic neuropathy, with long-term current use of insulin (HCC)   [place] [facility] Grayce Sessions, NP   3 months ago Type 2 diabetes mellitus with diabetic neuropathy, unspecified whether long term insulin use (HCC)   [place] [facility] Grayce Sessions, NP   8 years ago Scoliosis (and kyphoscoliosis), idiopathic   [place] [facility] [place], [place] E, MD   8 years ago Type 2 diabetes mellitus without complication   [place] [facility] [place], [place] E, MD   9 years ago Type 2 diabetes mellitus without complication   [place] [facility] Quentin Angst, MD       Future Appointments             In 1 month Randa Evens, Kinnie Scales, NP [place] [facility]

## 2023-08-02 ENCOUNTER — Ambulatory Visit (INDEPENDENT_AMBULATORY_CARE_PROVIDER_SITE_OTHER): Payer: No Typology Code available for payment source

## 2023-08-07 ENCOUNTER — Telehealth (INDEPENDENT_AMBULATORY_CARE_PROVIDER_SITE_OTHER): Payer: No Typology Code available for payment source | Admitting: Primary Care

## 2023-08-08 ENCOUNTER — Ambulatory Visit (INDEPENDENT_AMBULATORY_CARE_PROVIDER_SITE_OTHER): Payer: Self-pay | Admitting: *Deleted

## 2023-08-08 NOTE — Telephone Encounter (Signed)
Summary: Dose increase, pain smyptoms   Pt called reporting that he wants to increase his dosage because it relieves his pain when he takes more than what is prescribed. He wants a dose increase of gabapentin (NEURONTIN) 600 MG tablet  [facility] - [place], [place] - [address] [place] [address] Phone: [phone] Fax: [phone]         Reason for Disposition . [1] Caller has URGENT medicine question about med that PCP or specialist prescribed AND [2] triager unable to answer question  Answer Assessment - Initial Assessment Questions 1. NAME of MEDICINE: "What medicine(s) are you calling about?"     gabapentin (NEURONTIN) 600 MG tablet 600 mg, 2 times daily   2. QUESTION: "What is your question?" (e.g., double dose of medicine, side effect)     Patient has been taking 3-4 pills/day- usually taking 2 pills at a time for the pain 3. PRESCRIBER: "Who prescribed the medicine?" Reason: if prescribed by specialist, call should be referred to that group.     PCP 4. SYMPTOMS: "Do you have any symptoms?" If Yes, ask: "What symptoms are you having?"  "How bad are the symptoms (e.g., mild, moderate, severe)     Leg pain Permission given by patient to speak to Leodis Sias Patient has been taking up to double dosing of gabapentin to control leg pain- with this dose- he is able to move around and pain is under control. Patient is currently out of medication- he has been out for 3-4 days.  Advised I would send message to provider- request call back to let patient know if dose can be increased.  Protocols used: Medication Question Call-A-AH

## 2023-08-08 NOTE — Telephone Encounter (Signed)
  Chief Complaint: Request increase dose of: gabapentin (NEURONTIN) 600 MG tablet 600 mg, 2 times daily   Symptoms: leg pain Frequency: patient has been taking double dose- is currently out of medication  Disposition: [] ED /[] Urgent Care (no appt availability in office) / [] Appointment(In office/virtual)/ []  [place] [facility]/ [] Home Care/ [] Refused Recommended Disposition /[] [place] [facility]/ [x]  Follow-up with PCP Additional Notes: Patient is requesting increased dose of gabapentin- patient has been taking double dose- please call patient back to let him know if this is possible

## 2023-08-09 ENCOUNTER — Other Ambulatory Visit (INDEPENDENT_AMBULATORY_CARE_PROVIDER_SITE_OTHER): Payer: Self-pay | Admitting: Primary Care

## 2023-08-09 ENCOUNTER — Telehealth: Payer: Self-pay | Admitting: Primary Care

## 2023-08-09 DIAGNOSIS — E1140 Type 2 diabetes mellitus with diabetic neuropathy, unspecified: Secondary | ICD-10-CM

## 2023-08-09 DIAGNOSIS — Z760 Encounter for issue of repeat prescription: Secondary | ICD-10-CM

## 2023-08-09 NOTE — Telephone Encounter (Signed)
Will forward to case manager.

## 2023-08-09 NOTE — Telephone Encounter (Signed)
Copied from CRM [id number]. Topic: General - Other >> Aug 08, 2023  2:14 PM Franchot Heidelberg wrote: Reason for CRM: Pt called requesting to have home health services, wants a CNA. Pt is having trouble dressing, feeding, and bathing himself.

## 2023-08-09 NOTE — Telephone Encounter (Signed)
Will forward to provider  

## 2023-08-09 NOTE — Telephone Encounter (Signed)
Requested Prescriptions  Pending Prescriptions Disp Refills   gabapentin (NEURONTIN) 600 MG tablet [Pharmacy Med Name: GABAPENTIN 600 MG ORAL TABLET] 180 tablet 1    Sig: TAKE 1 TABLET (600 MG TOTAL) BY MOUTH 2 (TWO) TIMES DAILY.     Neurology: Anticonvulsants - gabapentin Passed - 08/09/2023  4:09 PM      Passed - Cr in normal range and within 360 days    Creat  Date Value Ref Range Status  03/16/2014 0.85 0.50 - 1.35 mg/dL Final   Creatinine, Ser  Date Value Ref Range Status  03/23/2023 0.96 0.61 - 1.24 mg/dL Final   Creatinine, Urine  Date Value Ref Range Status  02/19/2015 167.68 >20.0 mg/dL Final         Passed - Completed PHQ-2 or PHQ-9 in the last 360 days      Passed - Valid encounter within last 12 months    Recent Outpatient Visits           1 month ago Type 2 diabetes mellitus with diabetic neuropathy, with long-term current use of insulin (HCC)   [place] [facility] Grayce Sessions, NP   3 months ago Type 2 diabetes mellitus with diabetic neuropathy, unspecified whether long term insulin use (HCC)   [place] [facility] Grayce Sessions, NP   8 years ago Scoliosis (and kyphoscoliosis), idiopathic   [place] [facility] [place], [place] E, MD   8 years ago Type 2 diabetes mellitus without complication   [place] [facility] [place], [place] E, MD   9 years ago Type 2 diabetes mellitus without complication   [place] [facility] [place], Phylliss Blakes, MD       Future Appointments             In 1 week Randa Evens Kinnie Scales, NP [place] [facility]   In 1 month Randa Evens, Kinnie Scales, NP [place] [facility]

## 2023-08-10 DIAGNOSIS — Z419 Encounter for procedure for purposes other than remedying health state, unspecified: Secondary | ICD-10-CM | POA: Diagnosis not present

## 2023-08-10 NOTE — Telephone Encounter (Signed)
Yes ma'am I will resend

## 2023-08-13 ENCOUNTER — Other Ambulatory Visit (INDEPENDENT_AMBULATORY_CARE_PROVIDER_SITE_OTHER): Payer: Self-pay | Admitting: Primary Care

## 2023-08-13 ENCOUNTER — Ambulatory Visit (INDEPENDENT_AMBULATORY_CARE_PROVIDER_SITE_OTHER): Payer: Self-pay

## 2023-08-13 DIAGNOSIS — I10 Essential (primary) hypertension: Secondary | ICD-10-CM

## 2023-08-13 MED ORDER — HYDROCHLOROTHIAZIDE 12.5 MG PO TABS: 25.0000 mg | ORAL_TABLET | Freq: Every day | ORAL | 0 refills | Status: DC

## 2023-08-13 NOTE — Telephone Encounter (Signed)
Chief Complaint: Medication refill  Symptoms: Pain in hands and feet  Frequency: Chronic  Disposition: [] ED /[] Urgent Care (no appt availability in office) / [] Appointment(In office/virtual)/ []  [place] [facility]/ [x] Home Care/ [] Refused Recommended Disposition /[] [place] [facility]/ []  Follow-up with PCP Additional Notes: Patient stated he is completely out of his gabapentin Rx and has pain in his hands and feet. Patient also asked about all other current medications. Patient stated he needs a refill on his Hydrochlorothiazide as well. He only has one pill left. Patient declined to be triaged for the pain in his hands and feet. Patient stated he just needs his medicine. Gabapentin was refilled 08/09/23 should be available at pharmacy for pick up.  Reason for Disposition  [1] Caller requesting a prescription renewal (no refills left), no triage required, AND [2] triager able to renew prescription per department policy  Answer Assessment - Initial Assessment Questions 1. DRUG NAME: "What medicine do you need to have refilled?"     Hydrochlorothiazide and Gabapentin   2. REFILLS REMAINING: "How many refills are remaining?" (Note: The label on the medicine or pill bottle will show how many refills are remaining. If there are no refills remaining, then a renewal may be needed.)     1 refill  3. EXPIRATION DATE: "What is the expiration date?" (Note: The label states when the prescription will expire, and thus can no longer be refilled.)     08/08/24 4. PRESCRIBING HCP: "Who prescribed it?" Reason: If prescribed by specialist, call should be referred to that group.     Michelle  5. SYMPTOMS: "Do you have any symptoms?"     Pain in hands and feet  Protocols used: Medication Refill and Renewal Call-A-AH

## 2023-08-13 NOTE — Telephone Encounter (Signed)
Requested Prescriptions  Pending Prescriptions Disp Refills   hydrochlorothiazide (HYDRODIURIL) 12.5 MG tablet 180 tablet 0    Sig: Take 2 tablets (25 mg total) by mouth daily.     Cardiovascular: Diuretics - Thiazide Failed - 08/13/2023  2:23 PM      Failed - Last BP in normal range    BP Readings from Last 1 Encounters:  06/14/23 (!) 150/82         Passed - Cr in normal range and within 180 days    Creat  Date Value Ref Range Status  03/16/2014 0.85 0.50 - 1.35 mg/dL Final   Creatinine, Ser  Date Value Ref Range Status  03/23/2023 0.96 0.61 - 1.24 mg/dL Final   Creatinine, Urine  Date Value Ref Range Status  02/19/2015 167.68 >20.0 mg/dL Final         Passed - K in normal range and within 180 days    Potassium  Date Value Ref Range Status  03/23/2023 4.6 3.5 - 5.1 mmol/L Final         Passed - Na in normal range and within 180 days    Sodium  Date Value Ref Range Status  03/23/2023 137 135 - 145 mmol/L Final         Passed - Valid encounter within last 6 months    Recent Outpatient Visits           2 months ago Type 2 diabetes mellitus with diabetic neuropathy, with long-term current use of insulin (HCC)   [place] [facility] Grayce Sessions, NP   3 months ago Type 2 diabetes mellitus with diabetic neuropathy, unspecified whether long term insulin use (HCC)   [place] [facility] Grayce Sessions, NP   8 years ago Scoliosis (and kyphoscoliosis), idiopathic   [place] [facility] [place]. [facility] [place], [place] E, MD   8 years ago Type 2 diabetes mellitus without complication   [place] [facility] Dept Of [place]. [facility] [place], [place] E, MD   9 years ago Type 2 diabetes mellitus without complication    [facility] [place]. [facility] Quentin Angst, MD       Future Appointments              In 3 days Grayce Sessions, NP [facility]   In 1 month Randa Evens, Kinnie Scales, NP [facility]

## 2023-08-14 NOTE — Telephone Encounter (Signed)
Noted  

## 2023-08-16 ENCOUNTER — Ambulatory Visit (INDEPENDENT_AMBULATORY_CARE_PROVIDER_SITE_OTHER): Payer: No Typology Code available for payment source | Admitting: Primary Care

## 2023-08-31 ENCOUNTER — Telehealth (INDEPENDENT_AMBULATORY_CARE_PROVIDER_SITE_OTHER): Payer: Self-pay | Admitting: Primary Care

## 2023-08-31 NOTE — Telephone Encounter (Signed)
Pt is calling in because they need a generic version of insulin glargine (LANTUS) 100 unit/mL SOPN [[id number]] sent over to the pharmacy because pt is out of medication.

## 2023-09-03 ENCOUNTER — Other Ambulatory Visit: Payer: Self-pay

## 2023-09-04 ENCOUNTER — Other Ambulatory Visit (INDEPENDENT_AMBULATORY_CARE_PROVIDER_SITE_OTHER): Payer: Self-pay

## 2023-09-04 ENCOUNTER — Telehealth (INDEPENDENT_AMBULATORY_CARE_PROVIDER_SITE_OTHER): Payer: Self-pay | Admitting: Primary Care

## 2023-09-04 DIAGNOSIS — E1140 Type 2 diabetes mellitus with diabetic neuropathy, unspecified: Secondary | ICD-10-CM

## 2023-09-04 DIAGNOSIS — Z760 Encounter for issue of repeat prescription: Secondary | ICD-10-CM

## 2023-09-04 MED ORDER — INSULIN GLARGINE 100 UNITS/ML SOLOSTAR PEN: 15.0000 [IU] | PEN_INJECTOR | Freq: Every day | SUBCUTANEOUS | 3 refills | Status: DC

## 2023-09-04 NOTE — Telephone Encounter (Signed)
Rx has been sent to pharmacy

## 2023-09-04 NOTE — Telephone Encounter (Signed)
Burna Mortimer called back requesting an update on request, says patient is out of his medication.   Colon Flattery M   [facility]   08/31/23  4:37 PM Note Pt is calling in because they need a generic version of insulin glargine (LANTUS) 100 unit/mL SOPN [[id number]] sent over to the pharmacy because pt is out of medication.       Preferred pharmacy:   [facility] - [place], [place] - [address]  [address] [place] [address]  Phone: [phone] Fax: [phone]   Please advise when completed [phone]

## 2023-09-04 NOTE — Telephone Encounter (Signed)
Second request: Patient has requested generic form of : insulin glargine (LANTUS) 100 unit/mL SOPN  Patient partner is calling to request Rx because patient is out of medication

## 2023-09-05 ENCOUNTER — Other Ambulatory Visit: Payer: Self-pay | Admitting: Pharmacist

## 2023-09-05 ENCOUNTER — Ambulatory Visit (INDEPENDENT_AMBULATORY_CARE_PROVIDER_SITE_OTHER): Payer: Self-pay

## 2023-09-05 ENCOUNTER — Other Ambulatory Visit: Payer: Self-pay

## 2023-09-05 MED ORDER — BASAGLAR KWIKPEN 100 UNIT/ML ~~LOC~~ SOPN: 15.0000 [IU] | PEN_INJECTOR | Freq: Every day | SUBCUTANEOUS | 0 refills | Status: DC

## 2023-09-05 MED ORDER — INSULIN GLARGINE-YFGN 100 UNIT/ML ~~LOC~~ SOPN: 15.0000 [IU] | PEN_INJECTOR | Freq: Every day | SUBCUTANEOUS | 0 refills | Status: DC

## 2023-09-05 NOTE — Telephone Encounter (Signed)
Rxn for [employer] sent.

## 2023-09-05 NOTE — Telephone Encounter (Signed)
Pt states the insurance will not pay for lantus and is requesting generic to be sent in to pharmacy

## 2023-09-05 NOTE — Telephone Encounter (Signed)
Tried reaching back out to pt to make aware that rx for Basaglar was sent to the pharmacy pt didn't answer and was unable to lvm due to vm being full

## 2023-09-05 NOTE — Telephone Encounter (Signed)
[facility] called spoke with Lowella Bandy, [employer]. She ran the rx for insulin glargine (LANTUS)  but is not on formulary and insurance will not cover, she spoke with [facility] and they gave me the options that are covered : Basaglar, degludec, or levemir. Advised would send to [facility] for review. Called pt to advise him of speaking with pharmacy and sending message to provider for review, no answer, LVMTCB to discuss.   Summary: weak from no insulin   Burna Mortimer the girlfriend calling b/c the pt is weak from not having his insulin. Pt called 11/22 and needed this changed b/c hi insurance is not paying for lantus.   Called the office, spoke to [place], and she said  she would get a message to [place] to have this med changed.  Pt called on 11/22. Burna Mortimer says she called back yesterday, and the same medication was sent in yesterday.  Pt needs a different med b/c his insurance is not paying for the lantus.   Jay'A said pt can go to ED,  but I told her I would sent message to NT to reach out to the pt.         Reason for Disposition  [1] Pharmacy calling with prescription question AND [2] triager unable to answer question  Answer Assessment - Initial Assessment Questions 1. NAME of MEDICINE: "What medicine(s) are you calling about?"     Lantus  2. QUESTION: "What is your question?" (e.g., double dose of medicine, side effect)     Needing generic or other brand so insurance will cover  3. PRESCRIBER: "Who prescribed the medicine?" Reason: if prescribed by specialist, call should be referred to that group.     Marcelino Duster, NP  4. SYMPTOMS: "Do you have any symptoms?" If Yes, ask: "What symptoms are you having?"  "How bad are the symptoms (e.g., mild, moderate, severe)     Weak and fatigued  Protocols used: Medication Question Call-A-AH

## 2023-09-12 ENCOUNTER — Telehealth (INDEPENDENT_AMBULATORY_CARE_PROVIDER_SITE_OTHER): Payer: Self-pay | Admitting: Primary Care

## 2023-09-12 NOTE — Telephone Encounter (Signed)
 Called pt to confirm apt. Pts phone is unavailable.

## 2023-09-13 ENCOUNTER — Ambulatory Visit (INDEPENDENT_AMBULATORY_CARE_PROVIDER_SITE_OTHER): Payer: No Typology Code available for payment source | Admitting: Primary Care

## 2023-09-18 ENCOUNTER — Other Ambulatory Visit (INDEPENDENT_AMBULATORY_CARE_PROVIDER_SITE_OTHER): Payer: Self-pay | Admitting: Primary Care

## 2023-09-18 DIAGNOSIS — E1140 Type 2 diabetes mellitus with diabetic neuropathy, unspecified: Secondary | ICD-10-CM

## 2023-09-18 DIAGNOSIS — Z760 Encounter for issue of repeat prescription: Secondary | ICD-10-CM

## 2023-09-19 ENCOUNTER — Ambulatory Visit (INDEPENDENT_AMBULATORY_CARE_PROVIDER_SITE_OTHER): Payer: No Typology Code available for payment source | Admitting: Primary Care

## 2023-09-19 NOTE — Telephone Encounter (Signed)
Requested Prescriptions  Pending Prescriptions Disp Refills   glipiZIDE (GLUCOTROL) 10 MG tablet [Pharmacy Med Name: GLIPIZIDE 10 MG ORAL TABLET] 60 tablet 0    Sig: TAKE 1 TABLET (10 MG TOTAL) BY MOUTH 2 (TWO) TIMES DAILY BEFORE A MEAL.     Endocrinology:  Diabetes - Sulfonylureas Failed - 09/18/2023  3:38 PM      Failed - HBA1C is between 0 and 7.9 and within 180 days    Hgb A1c MFr Bld  Date Value Ref Range Status  03/22/2023 12.5 (H) 4.8 - 5.6 % Final    Comment:    (NOTE) Pre diabetes:          5.7%-6.4%  Diabetes:              >6.4%  Glycemic control for   <7.0% adults with diabetes          Passed - Cr in normal range and within 360 days    Creat  Date Value Ref Range Status  03/16/2014 0.85 0.50 - 1.35 mg/dL Final   Creatinine, Ser  Date Value Ref Range Status  03/23/2023 0.96 0.61 - 1.24 mg/dL Final   Creatinine, Urine  Date Value Ref Range Status  02/19/2015 167.68 >20.0 mg/dL Final         Passed - Valid encounter within last 6 months    Recent Outpatient Visits           3 months ago Type 2 diabetes mellitus with diabetic neuropathy, with long-term current use of insulin (HCC)   [place] [facility] Grayce Sessions, NP   5 months ago Type 2 diabetes mellitus with diabetic neuropathy, unspecified whether long term insulin use (HCC)   [place] [facility] Grayce Sessions, NP   8 years ago Scoliosis (and kyphoscoliosis), idiopathic   [place] [facility] [facility] [place]. [facility] [place], [place] E, MD   8 years ago Type 2 diabetes mellitus without complication   [place] [facility] - [facility] Dept Of [place]. [facility] [place], [place] E, MD   9 years ago Type 2 diabetes mellitus without complication   [place] [facility] - [facility] Dept Of [place]. [facility] Quentin Angst, MD

## 2023-09-26 ENCOUNTER — Other Ambulatory Visit (INDEPENDENT_AMBULATORY_CARE_PROVIDER_SITE_OTHER): Payer: Self-pay | Admitting: Primary Care

## 2023-09-26 DIAGNOSIS — E1140 Type 2 diabetes mellitus with diabetic neuropathy, unspecified: Secondary | ICD-10-CM

## 2023-09-26 NOTE — Telephone Encounter (Unsigned)
Copied from CRM [id number]. Topic: General - Other >> Sep 26, 2023  4:41 PM Everette C wrote: Reason for CRM: Medication Refill - Most Recent Primary Care Visit:  Provider: Grayce Sessions Department: [facility] Visit Type: MYCHART VIDEO VISIT Date: 08/07/2023  Medication: Insulin Pen Needle (PEN NEEDLES) 32G X 5 MM MISC [[id number]]  Insulin Glargine (BASAGLAR KWIKPEN) 100 UNIT/ML [[id number]]  Has the patient contacted their pharmacy? Yes (Agent: If no, request that the patient contact the pharmacy for the refill. If patient does not wish to contact the pharmacy document the reason why and proceed with request.) (Agent: If yes, when and what did the pharmacy advise?)  Is this the correct pharmacy for this prescription? Yes If no, delete pharmacy and type the correct one.  This is the patient's preferred pharmacy:  [facility] [phone] - [place], [place] - [address] AT [facility] [address] [place] [address] Phone: [phone] Fax: [phone] Hours: Not open 24 hours     Has the prescription been filled recently? Yes  Is the patient out of the medication? Yes  Has the patient been seen for an appointment in the last year OR does the patient have an upcoming appointment? Yes  Can we respond through MyChart? No  Agent: Please be advised that Rx refills may take up to 3 business days. We ask that you follow-up with your pharmacy.

## 2023-09-27 MED ORDER — BASAGLAR KWIKPEN 100 UNIT/ML ~~LOC~~ SOPN: 15.0000 [IU] | PEN_INJECTOR | Freq: Every day | SUBCUTANEOUS | 0 refills | Status: DC

## 2023-09-27 MED ORDER — PEN NEEDLES 32G X 5 MM MISC: 1.0000 | Freq: Two times a day (BID) | 0 refills | Status: DC

## 2023-09-27 NOTE — Telephone Encounter (Signed)
Requested Prescriptions  Pending Prescriptions Disp Refills   Insulin Glargine (BASAGLAR KWIKPEN) 100 UNIT/ML 15 mL 0    Sig: Inject 15 Units into the skin daily.     Endocrinology:  Diabetes - Insulins Failed - 09/27/2023  7:56 AM      Failed - HBA1C is between 0 and 7.9 and within 180 days    Hgb A1c MFr Bld  Date Value Ref Range Status  03/22/2023 12.5 (H) 4.8 - 5.6 % Final    Comment:    (NOTE) Pre diabetes:          5.7%-6.4%  Diabetes:              >6.4%  Glycemic control for   <7.0% adults with diabetes          Passed - Valid encounter within last 6 months    Recent Outpatient Visits           3 months ago Type 2 diabetes mellitus with diabetic neuropathy, with long-term current use of insulin (HCC)   [place] [facility] Grayce Sessions, NP   5 months ago Type 2 diabetes mellitus with diabetic neuropathy, unspecified whether long term insulin use (HCC)   [place] [facility] Grayce Sessions, NP   8 years ago Scoliosis (and kyphoscoliosis), idiopathic   [place] [facility] - [facility] Dept Of [place]. [facility] [place], [place] E, MD   8 years ago Type 2 diabetes mellitus without complication   [place] [facility] - A Dept Of [place]. [facility] [place], [place] E, MD   9 years ago Type 2 diabetes mellitus without complication   [place] [facility] - A Dept Of [place]. [facility] Hyman Hopes, Phylliss Blakes, MD       Future Appointments             In 1 week Grayce Sessions, NP [place] [facility]             Insulin Pen Needle (PEN NEEDLES) 32G X 5 MM MISC 200 each 0    Sig: 1 each by Does not apply route 2 (two) times daily.     Endocrinology: Diabetes - Testing Supplies Passed - 09/27/2023  7:56 AM      Passed - Valid encounter within last 12 months    Recent Outpatient Visits           3 months ago Type 2 diabetes  mellitus with diabetic neuropathy, with long-term current use of insulin (HCC)   [place] [facility] Grayce Sessions, NP   5 months ago Type 2 diabetes mellitus with diabetic neuropathy, unspecified whether long term insulin use (HCC)   [place] [facility] Grayce Sessions, NP   8 years ago Scoliosis (and kyphoscoliosis), idiopathic   [place] [facility] - A Dept Of [place]. [facility] [place], [place] E, MD   8 years ago Type 2 diabetes mellitus without complication   [place] [facility] - A Dept Of [place]. [facility] [place], [place] E, MD   9 years ago Type 2 diabetes mellitus without complication    [facility] - A Dept Of [place]. [facility] Quentin Angst, MD       Future Appointments             In 1 week  Grayce Sessions, NP [place] [facility]

## 2023-10-01 ENCOUNTER — Telehealth: Payer: Self-pay | Admitting: Primary Care

## 2023-10-01 NOTE — Telephone Encounter (Signed)
Copied from CRM [id number]. Topic: Medical Record Request - Other >> Oct 01, 2023  1:59 PM Marlow Baars wrote: Reason for CRM: Aaliyah with [employer] called in inquiring about a fax that was sent last week on 12/19 regarding continuous glucose monitor system as well as notes and records. Please assist as soon as possible and let her know when it has been faxed to [phone].

## 2023-10-02 NOTE — Telephone Encounter (Signed)
Harriett Sine from [employer] is calling to follow up on the diabetic supply form. Is requesting an update as soon as possible. Will re-fax today.   Please advise.

## 2023-10-02 NOTE — Telephone Encounter (Signed)
Form has been faxed.

## 2023-10-04 ENCOUNTER — Ambulatory Visit (INDEPENDENT_AMBULATORY_CARE_PROVIDER_SITE_OTHER): Payer: No Typology Code available for payment source | Admitting: Primary Care

## 2023-10-05 ENCOUNTER — Other Ambulatory Visit (INDEPENDENT_AMBULATORY_CARE_PROVIDER_SITE_OTHER): Payer: Self-pay | Admitting: Primary Care

## 2023-10-05 DIAGNOSIS — Z760 Encounter for issue of repeat prescription: Secondary | ICD-10-CM

## 2023-10-05 DIAGNOSIS — Z794 Long term (current) use of insulin: Secondary | ICD-10-CM

## 2023-10-05 DIAGNOSIS — I10 Essential (primary) hypertension: Secondary | ICD-10-CM

## 2023-10-05 NOTE — Telephone Encounter (Signed)
Medication Refill -  Most Recent Primary Care Visit:  Provider: Grayce Sessions  Department: [facility]  Visit Type: MYCHART VIDEO VISIT  Date: 08/07/2023  Medication: glucose blood (ACCU-CHEK GUIDE) test strip   "Needs longer needles, the ones she ordered are too short" Insulin Pen Needle (PEN NEEDLES) 32G X 5 MM MISC   gabapentin (NEURONTIN) 600 MG tablet   losartan (COZAAR) 25 MG tablet   Has the patient contacted their pharmacy? Yes (Agent: If no, request that the patient contact the pharmacy for the refill. If patient does not wish to contact the pharmacy document the reason why and proceed with request.) (Agent: If yes, when and what did the pharmacy advise?)  Is this the correct pharmacy for this prescription? Yes. If no, delete pharmacy and type the correct one.  This is the patient's preferred pharmacy:   [facility] - [place], [place] - [address] [place] [address] Phone: [phone] Fax: [phone]    Has the prescription been filled recently? Yes  Is the patient out of the medication? Yes  Has the patient been seen for an appointment in the last year OR does the patient have an upcoming appointment? Yes  Can we respond through MyChart? Yes  Agent: Please be advised that Rx refills may take up to 3 business days. We ask that you follow-up with your pharmacy.

## 2023-10-08 NOTE — Telephone Encounter (Signed)
[employer] is calling in again to check the status of a form they sent over to the office. [employer] is going to resend the form and would like a call when the form has been received.

## 2023-10-11 NOTE — Telephone Encounter (Signed)
 Requested medication (s) are due for refill today:   No   Being requested too soon  Requested medication (s) are on the active medication list:   Yes for all 4  Future visit scheduled:   Yes 10/16/2023   Last ordered: Gabapentin  08/09/2023 #180, 1 refill;   Losartan  08/14/2023 #90, 0 refills - labs are due;   Pen Needles 09/27/2023 #200, 0 refills - the needles are too short per pt.;   Accu-Chek test strips 06/25/2023 #100, 12 refills  Unable to refill due to labs being due and a request for longer pen needles.     Requested Prescriptions  Pending Prescriptions Disp Refills   gabapentin  (NEURONTIN ) 600 MG tablet 180 tablet 1    Sig: Take 1 tablet (600 mg total) by mouth 2 (two) times daily.     Neurology: Anticonvulsants - gabapentin  Passed - 10/11/2023  8:13 AM      Passed - Cr in normal range and within 360 days    Creat  Date Value Ref Range Status  03/16/2014 0.85 0.50 - 1.35 mg/dL Final   Creatinine, Ser  Date Value Ref Range Status  03/23/2023 0.96 0.61 - 1.24 mg/dL Final   Creatinine, Urine  Date Value Ref Range Status  02/19/2015 167.68 >20.0 mg/dL Final         Passed - Completed PHQ-2 or PHQ-9 in the last 360 days      Passed - Valid encounter within last 12 months    Recent Outpatient Visits           3 months ago Type 2 diabetes mellitus with diabetic neuropathy, with long-term current use of insulin  (HCC)   [place] [facility] Celestia Rosaline SQUIBB, NP   5 months ago Type 2 diabetes mellitus with diabetic neuropathy, unspecified whether long term insulin  use (HCC)   [place] [facility] Celestia Rosaline SQUIBB, NP   8 years ago Scoliosis (and kyphoscoliosis), idiopathic   [place] [facility] [place] - [facility] Dept Of [place]. [facility] Jegede, Olugbemiga E, MD   8 years ago Type 2 diabetes mellitus without complication   [place] [facility] - A Dept Of [place]. [facility] Jegede, Olugbemiga E, MD    9 years ago Type 2 diabetes mellitus without complication    [facility] - A Dept Of [place]. [facility] Jegede, Olugbemiga E, MD       Future Appointments             In 5 days Celestia Rosaline SQUIBB, NP  [facility]             losartan  (COZAAR ) 25 MG tablet 90 tablet 0    Sig: Take 1 tablet (25 mg total) by mouth daily.     Cardiovascular:  Angiotensin Receptor Blockers Failed - 10/11/2023  8:13 AM      Failed - Cr in normal range and within 180 days    Creat  Date Value Ref Range Status  03/16/2014 0.85 0.50 - 1.35 mg/dL Final   Creatinine, Ser  Date Value Ref Range Status  03/23/2023 0.96 0.61 - 1.24 mg/dL Final   Creatinine, Urine  Date Value Ref Range Status  02/19/2015 167.68 >20.0 mg/dL Final         Failed - K in normal range and within 180 days    Potassium  Date Value Ref Range Status  03/23/2023 4.6 3.5 - 5.1 mmol/L  Final         Failed - Last BP in normal range    BP Readings from Last 1 Encounters:  06/14/23 (!) 150/82         Passed - Patient is not pregnant      Passed - Valid encounter within last 6 months    Recent Outpatient Visits           3 months ago Type 2 diabetes mellitus with diabetic neuropathy, with long-term current use of insulin  (HCC)   [place] [facility] Celestia Rosaline SQUIBB, NP   5 months ago Type 2 diabetes mellitus with diabetic neuropathy, unspecified whether long term insulin  use (HCC)   [place] [facility] Celestia Rosaline SQUIBB, NP   8 years ago Scoliosis (and kyphoscoliosis), idiopathic   [place] [facility] [place] - [facility] Dept Of [place]. [facility] Jegede, Olugbemiga E, MD   8 years ago Type 2 diabetes mellitus without complication   [place] [facility] - A Dept Of [place]. [facility] Jegede, Olugbemiga E, MD   9 years ago Type 2 diabetes mellitus without complication   [place] [facility] - A Dept Of [place]. [facility] Jegede, Olugbemiga E, MD       Future Appointments             In 5 days Celestia Rosaline SQUIBB, NP [place] [facility]             Insulin  Pen Needle (PEN NEEDLES) 32G X 5 MM MISC 200 each 0    Sig: 1 each by Does not apply route 2 (two) times daily.     Endocrinology: Diabetes - Testing Supplies Passed - 10/11/2023  8:13 AM      Passed - Valid encounter within last 12 months    Recent Outpatient Visits           3 months ago Type 2 diabetes mellitus with diabetic neuropathy, with long-term current use of insulin  (HCC)   [place] [facility] Celestia Rosaline SQUIBB, NP   5 months ago Type 2 diabetes mellitus with diabetic neuropathy, unspecified whether long term insulin  use [facility])   [place] [facility] Celestia Rosaline SQUIBB, NP   8 years ago Scoliosis (and kyphoscoliosis), idiopathic   [place] [facility] [place] - A Dept Of [place]. [facility] Jegede, Olugbemiga E, MD   8 years ago Type 2 diabetes mellitus without complication   [place] [facility] - A Dept Of [place]. [facility] Jegede, Olugbemiga E, MD   9 years ago Type 2 diabetes mellitus without complication   [place] [facility] - A Dept Of . [facility] Jegede, Olugbemiga E, MD       Future Appointments             In 5 days Celestia Rosaline SQUIBB, NP [facility]

## 2023-10-12 NOTE — Telephone Encounter (Signed)
 Please refill if appropriate

## 2023-10-16 ENCOUNTER — Ambulatory Visit (INDEPENDENT_AMBULATORY_CARE_PROVIDER_SITE_OTHER): Payer: No Typology Code available for payment source | Admitting: Primary Care

## 2023-10-16 ENCOUNTER — Encounter (INDEPENDENT_AMBULATORY_CARE_PROVIDER_SITE_OTHER): Payer: Self-pay | Admitting: Primary Care

## 2023-10-16 VITALS — BP 138/90 | HR 87 | Resp 16 | Ht 69.0 in | Wt 131.8 lb

## 2023-10-16 DIAGNOSIS — Z122 Encounter for screening for malignant neoplasm of respiratory organs: Secondary | ICD-10-CM

## 2023-10-16 DIAGNOSIS — E1140 Type 2 diabetes mellitus with diabetic neuropathy, unspecified: Secondary | ICD-10-CM

## 2023-10-16 DIAGNOSIS — Z1159 Encounter for screening for other viral diseases: Secondary | ICD-10-CM | POA: Diagnosis not present

## 2023-10-16 DIAGNOSIS — Z794 Long term (current) use of insulin: Secondary | ICD-10-CM | POA: Diagnosis not present

## 2023-10-16 DIAGNOSIS — I10 Essential (primary) hypertension: Secondary | ICD-10-CM | POA: Diagnosis not present

## 2023-10-16 DIAGNOSIS — Z2821 Immunization not carried out because of patient refusal: Secondary | ICD-10-CM

## 2023-10-16 LAB — POCT GLYCOSYLATED HEMOGLOBIN (HGB A1C): HbA1c, POC (controlled diabetic range): 8.1 % — AB (ref 0.0–7.0)

## 2023-10-16 MED ORDER — GABAPENTIN 600 MG PO TABS
600.0000 mg | ORAL_TABLET | Freq: Two times a day (BID) | ORAL | 1 refills | Status: AC
Start: 2023-10-16 — End: 2024-10-15

## 2023-10-16 MED ORDER — PEN NEEDLES 32G X 5 MM MISC: 1.0000 | Freq: Two times a day (BID) | 0 refills | Status: DC

## 2023-10-16 MED ORDER — LOSARTAN POTASSIUM 25 MG PO TABS: 25.0000 mg | ORAL_TABLET | Freq: Every day | ORAL | 0 refills | Status: AC

## 2023-10-16 NOTE — Progress Notes (Signed)
 Subjective:  Patient ID: Allen Hernandez, male    DOB: [birth year]  Age: 62 y.o. MRN: [id number]  CC:   Allen Hernandez presents forFollow-up of diabetes. Patient does not check blood sugar at home HPI  Compliant with meds - Yes Checking CBGs? Yes  Fasting avg - <200  Postprandial average -  Exercising regularly? - Yes Watching carbohydrate intake? - Yes Neuropathy ? - No Hypoglycemic events - No  - Recovers with :   Pertinent ROS:  Polyuria - No Polydipsia - No Vision problems - No  Medications as noted below. Taking them regularly without complication/adverse reaction being reported today.   History Allen Hernandez has a past medical history of Diabetes (HCC), Neuropathy due to secondary diabetes (HCC) (08/2014), Pneumonia, Renal disorder, and Respiratory failure (HCC).   He has a past surgical history that includes Spine surgery.   His family history includes CAD in his father; Cancer in his mother; Diabetes in his brother.He reports that he has been smoking cigarettes. He has never used smokeless tobacco. He reports current alcohol  use of about 4.0 standard drinks of alcohol  per week. He reports current drug use. Drug: Cocaine .  Current Outpatient Medications on File Prior to Visit  Medication Sig Dispense Refill   Blood Glucose Monitoring Suppl DEVI 1 each by Does not apply route in the morning, at noon, and at bedtime. May substitute to any manufacturer covered by patient's insurance. 1 each 0   gabapentin  (NEURONTIN ) 600 MG tablet TAKE 1 TABLET (600 MG TOTAL) BY MOUTH 2 (TWO) TIMES DAILY. 180 tablet 1   glipiZIDE  (GLUCOTROL ) 10 MG tablet TAKE 1 TABLET (10 MG TOTAL) BY MOUTH 2 (TWO) TIMES DAILY BEFORE A MEAL. 60 tablet 0   glucose blood (ACCU-CHEK GUIDE) test strip Check blood sugar three times a day before meals 100 each 12   hydrochlorothiazide  (HYDRODIURIL ) 12.5 MG tablet TAKE 2 TABLETS (25 MG TOTAL) BY MOUTH DAILY. 90 tablet 0   Insulin  Glargine (BASAGLAR  KWIKPEN) 100 UNIT/ML  Inject 15 Units into the skin daily. 15 mL 0   Insulin  Pen Needle (PEN NEEDLES) 32G X 5 MM MISC 1 each by Does not apply route 2 (two) times daily. 200 each 0   insulin  regular (HUMULIN  R) 100 units/mL injection For blood sugars 200 -250 give 4 units of insulin , 251-300 give 6 units, 301-350 give 8 units, 351-400 give 10 units,> 400 give 12 units and call M.D. Discussed hypoglycemia protocol. 10 mL 3   losartan  (COZAAR ) 25 MG tablet TAKE 1 TABLET (25 MG TOTAL) BY MOUTH DAILY. 90 tablet 0   nicotine  (NICODERM CQ  - DOSED IN MG/24 HOURS) 14 mg/24hr patch Place 1 patch (14 mg total) onto the skin daily. (Patient not taking: Reported on 06/14/2023) 28 patch 0   omeprazole (PRILOSEC) 20 MG capsule Take 20 mg by mouth daily.     VENTOLIN  HFA 108 (90 Base) MCG/ACT inhaler INHALE 2 PUFFS INTO THE LUNGS EVERY 6 (SIX) HOURS AS NEEDED FOR WHEEZING OR SHORTNESS OF BREATH. 18 g 1   Current Facility-Administered Medications on File Prior to Visit  Medication Dose Route Frequency Provider Last Rate Last Admin   insulin  aspart (novoLOG ) injection 10 Units  10 Units Subcutaneous Once Celestia Rosaline SQUIBB, NP        Review of Systems Comprehensive ROS Pertinent positive and negative noted in HPI   Objective:  BP (!) 138/90   Pulse 87   Resp 16   Ht 5' 9 (1.753 m)   Wt  131 lb 12.8 oz (59.8 kg)   SpO2 98%   BMI 19.46 kg/m    BP Readings from Last 3 Encounters:  06/14/23 (!) 150/82  04/19/23 (!) 148/98  03/25/23 122/70    Wt Readings from Last 3 Encounters:  06/14/23 131 lb 9.6 oz (59.7 kg)  04/19/23 126 lb (57.2 kg)  03/25/23 125 lb 4.8 oz (56.8 kg)    Physical Exam General: No apparent distress. Eyes: Extraocular eye movements intact, pupils equal and round. Neck: Supple, trachea midline. Thyroid : No enlargement, mobile without fixation, no tenderness. Cardiovascular: Regular rhythm and rate, no murmur, normal radial pulses. Respiratory: Normal respiratory effort, clear to  auscultation. Gastrointestinal: Normal pitch active bowel sounds, nontender abdomen without distention or appreciable hepatomegal Musculoskeletal: Normal muscle tone, no tenderness on palpation of tibia, no excessive thoracic kyphosis. Skin: Appropriate warmth, no visible rash. Mental status: Alert, conversant, speech clear, thought logical, appropriate mood and affect, no hallucinations or delusions evident. Hematologic/lymphatic: No cervical adenopathy, no visible ecchymoses.  Lab Results  Component Value Date   HGBA1C 12.5 (H) 03/22/2023   HGBA1C 8.2 (H) 02/07/2018   HGBA1C 7.60 10/27/2014    Lab Results  Component Value Date   WBC 10.7 (H) 03/23/2023   HGB 14.2 03/23/2023   HCT 44.0 03/23/2023   PLT 248 03/23/2023   GLUCOSE 263 (H) 03/23/2023   CHOL 150 11/04/2013   TRIG 63 11/04/2013   HDL 50 11/04/2013   LDLCALC 87 11/04/2013   ALT 17 07/04/2021   AST 16 07/04/2021   NA 137 03/23/2023   K 4.6 03/23/2023   CL 103 03/23/2023   CREATININE 0.96 03/23/2023   BUN 12 03/23/2023   CO2 25 03/23/2023   TSH 0.686 03/16/2014   HGBA1C 12.5 (H) 03/22/2023   MICROALBUR 0.88 03/16/2014     Assessment & Plan:  Allen Hernandez was seen today for diabetes and hypertension.  Diagnoses and all orders for this visit:  Type 2 diabetes mellitus with diabetic neuropathy, with long-term current use of insulin  (HCC) -     POCT glycosylated hemoglobin (Hb A1C) 8.1 vast improvement from 12.5 03/22/23 - educated on lifestyle modifications, including but not limited to diet choices and adding exercise to daily routine.  . -     Lipid Panel -     CMP14+EGFR -     CBC with Differential  Essential hypertension BP goal - < 140/90 Explained that having normal blood pressure is the goal and medications are helping to get to goal and maintain normal blood pressure. DIET: Limit salt intake, read nutrition labels to check salt content, limit fried and high fatty foods  Avoid using multisymptom OTC cold  preparations that generally contain sudafed which can rise BP. Consult with pharmacist on best cold relief products to use for persons with HTN EXERCISE Discussed incorporating exercise such as walking - 30 minutes most days of the week and can do in 10 minute intervals    -     CMP14+EGFR -     CBC with Differential  Encounter for screening for lung cancer -     Ambulatory Referral for Lung Cancer Screening [REF832]  Need for hepatitis C screening test -     HCV Ab w Reflex to Quant PCR  Tetanus, diphtheria, and acellular pertussis (Tdap) vaccination declined  Herpes zoster vaccination declined  Other orders -     Interpretation:     Follow-up:  No follow-ups on file.  The above assessment and management plan was discussed with  the patient. The patient verbalized understanding of and has agreed to the management plan. Patient is aware to call the clinic if symptoms fail to improve or worsen. Patient is aware when to return to the clinic for a follow-up visit. Patient educated on when it is appropriate to go to the emergency department.   Rosaline Bohr, NP-C

## 2023-10-17 ENCOUNTER — Other Ambulatory Visit: Payer: Self-pay

## 2023-10-17 ENCOUNTER — Other Ambulatory Visit: Payer: Self-pay | Admitting: Pharmacist

## 2023-10-17 LAB — CBC WITH DIFFERENTIAL/PLATELET
Basophils Absolute: 0 10*3/uL (ref 0.0–0.2)
Basos: 0 %
EOS (ABSOLUTE): 0.2 10*3/uL (ref 0.0–0.4)
Eos: 2 %
Hematocrit: 47.9 % (ref 37.5–51.0)
Hemoglobin: 15.6 g/dL (ref 13.0–17.7)
Immature Grans (Abs): 0 10*3/uL (ref 0.0–0.1)
Immature Granulocytes: 0 %
Lymphocytes Absolute: 1.6 10*3/uL (ref 0.7–3.1)
Lymphs: 25 %
MCH: 29 pg (ref 26.6–33.0)
MCHC: 32.6 g/dL (ref 31.5–35.7)
MCV: 89 fL (ref 79–97)
Monocytes Absolute: 0.6 10*3/uL (ref 0.1–0.9)
Monocytes: 10 %
Neutrophils Absolute: 3.9 10*3/uL (ref 1.4–7.0)
Neutrophils: 63 %
Platelets: 305 10*3/uL (ref 150–450)
RBC: 5.38 x10E6/uL (ref 4.14–5.80)
RDW: 11.5 % — ABNORMAL LOW (ref 11.6–15.4)
WBC: 6.3 10*3/uL (ref 3.4–10.8)

## 2023-10-17 LAB — CMP14+EGFR
ALT: 15 [IU]/L (ref 0–44)
AST: 17 [IU]/L (ref 0–40)
Albumin: 4.5 g/dL (ref 3.9–4.9)
Alkaline Phosphatase: 86 [IU]/L (ref 44–121)
BUN/Creatinine Ratio: 9 — ABNORMAL LOW (ref 10–24)
BUN: 10 mg/dL (ref 8–27)
Bilirubin Total: 0.3 mg/dL (ref 0.0–1.2)
CO2: 24 mmol/L (ref 20–29)
Calcium: 10.1 mg/dL (ref 8.6–10.2)
Chloride: 104 mmol/L (ref 96–106)
Creatinine, Ser: 1.12 mg/dL (ref 0.76–1.27)
Globulin, Total: 2.7 g/dL (ref 1.5–4.5)
Glucose: 197 mg/dL — ABNORMAL HIGH (ref 70–99)
Potassium: 4.9 mmol/L (ref 3.5–5.2)
Sodium: 143 mmol/L (ref 134–144)
Total Protein: 7.2 g/dL (ref 6.0–8.5)
eGFR: 75 mL/min/{1.73_m2} (ref >59)

## 2023-10-17 LAB — LIPID PANEL
Chol/HDL Ratio: 3 {ratio} (ref 0.0–5.0)
Cholesterol, Total: 154 mg/dL (ref 100–199)
HDL: 51 mg/dL (ref >39)
LDL Chol Calc (NIH): 86 mg/dL (ref 0–99)
Triglycerides: 88 mg/dL (ref 0–149)
VLDL Cholesterol Cal: 17 mg/dL (ref 5–40)

## 2023-10-17 LAB — HCV AB W REFLEX TO QUANT PCR: HCV Ab: NONREACTIVE

## 2023-10-17 LAB — HCV INTERPRETATION

## 2023-10-17 MED ORDER — PEN NEEDLES 31G X 8 MM MISC
6 refills | Status: AC
  Filled 2023-10-17: qty 100, 90d supply, fill #0

## 2023-10-19 ENCOUNTER — Telehealth (INDEPENDENT_AMBULATORY_CARE_PROVIDER_SITE_OTHER): Payer: Self-pay | Admitting: Primary Care

## 2023-10-19 NOTE — Telephone Encounter (Signed)
 Kim with [employer]  [facility] calling to confirm form was received for patient requesting prescription for dexcom receiver and sensors. Form was originally faxed to office on 09/26/2023, 10/09/2023 and then again 10/12/2023.  Forms will be faxed over to office again.   Kim requesting form be filled out and faxed back to , [phone]

## 2023-10-26 ENCOUNTER — Telehealth: Payer: Self-pay | Admitting: Primary Care

## 2023-10-26 ENCOUNTER — Telehealth (INDEPENDENT_AMBULATORY_CARE_PROVIDER_SITE_OTHER): Payer: Self-pay

## 2023-10-26 NOTE — Telephone Encounter (Signed)
Copied from CRM [id number]. Topic: General - Other >> Oct 26, 2023  9:33 AM Franchot Heidelberg wrote: Reason for CRM: Needs new order faxed back to [employer], the recent one was missing a DX code it was marked incomplete.  For a CGM Glucose Monitor and supplies   Fax: [phone] Best contact: [phone] [place]  She says that this has been re-faxed today, also requesting the most recent office visit notes.   Also states they need all of this back as soon as possible.

## 2023-10-26 NOTE — Telephone Encounter (Signed)
 duplicate

## 2023-10-29 ENCOUNTER — Other Ambulatory Visit: Payer: Self-pay

## 2023-11-09 ENCOUNTER — Telehealth: Payer: Self-pay

## 2023-11-09 NOTE — Telephone Encounter (Signed)
Copied from CRM [id number]. Topic: Clinical - Home Health Verbal Orders >> Nov 09, 2023  1:06 PM Dennison Nancy wrote: Caller/Agency: christina -[employer] [facility]  Callback Number: [phone] option 3   fax  [phone] Service Requested: need clinical notes a request for a glucose monitoring device,prescription order missing steps 1 ,2,3  Frequency: N/A  Any new concerns about the patient? No

## 2023-11-09 NOTE — Telephone Encounter (Signed)
Copied from CRM [id number]. Topic: General - Other >> Nov 08, 2023  4:22 PM Maxwell Marion wrote: Reason for CRM: Claris Gower with [employer] called in regards to patient's glucose monitor. She is needing the diagnostic code for diabetes and needs to know if patient is on insulin or hypoglycemic, says this information was missing. Call back number is [phone], opt 3

## 2023-11-12 NOTE — Telephone Encounter (Signed)
Reached out to [facility] and spoke with Regan and made aware that pt doesn't check blood sugars and he hasn't spoken with provider in regards to glucose monitor. Per Regan he will make a note of it

## 2023-11-12 NOTE — Telephone Encounter (Signed)
Reached out to [facility] and spoke with Regan and made aware that pt doesn't check blood sugars and he hasn't spoken with provider in regards to glucose monitor. Per Regan he will make a note of it. Will check with patient at follow up

## 2023-11-14 ENCOUNTER — Telehealth: Payer: Self-pay

## 2023-11-14 NOTE — Telephone Encounter (Signed)
 Copied from CRM [id number]. Topic: Medical Record Request - Provider/Facility Request >> Nov 14, 2023  9:22 AM Myrick T wrote: Reason for CRM: Myles Garden of the notes from 2/3. She wants a call back to get the diagnosis codes, insulin  usage and all hypoglycemia edpisodes. Please contact Garden at [phone] option 3.

## 2023-11-14 NOTE — Telephone Encounter (Signed)
 Reached out to [place] and spoke to [place] T and went over message from 2/3. Per Benancio Bracket she will notate chart and reach out to pt

## 2023-11-15 NOTE — Telephone Encounter (Signed)
 I have reached out to [facility] twice and spoke to 2 different representatives and made aware of previous message on 2/3.

## 2023-11-15 NOTE — Telephone Encounter (Signed)
 Copied from CRM [id number]. Topic: General - Other >> Nov 15, 2023 11:06 AM Makayla J wrote: Reason for CRM: [facility] called in stating they received an order for the pts Glucose monitor but order needs a diagnosis code as well as the pts chart notes. Can be faxed to [phone]

## 2023-12-06 ENCOUNTER — Telehealth: Payer: Self-pay

## 2023-12-06 NOTE — Telephone Encounter (Signed)
 Copied from CRM [id number]. Topic: Clinical - Medication Question >> Dec 06, 2023  9:53 AM Gery Pray wrote: Reason for CRM: Destiny from [employer] will be faxing over a Prescription Change Request for patient and would like for the provider to review, deny or approve,  and signed and faxed back to [phone].

## 2023-12-07 ENCOUNTER — Other Ambulatory Visit: Payer: Self-pay | Admitting: Family Medicine

## 2023-12-07 ENCOUNTER — Telehealth: Payer: Self-pay

## 2023-12-07 NOTE — Telephone Encounter (Signed)
 Routing to office

## 2023-12-07 NOTE — Telephone Encounter (Signed)
 Copied from CRM [id number]. Topic: Clinical - Prescription Issue >> Dec 05, 2023 11:27 AM Fuller Mandril wrote: Reason for CRM: [employer] called states they are sending fax for Insulin Glargine (BASAGLAR KWIKPEN) 100 UNIT/ML. Need provider to complete and send back. Thank You

## 2023-12-31 ENCOUNTER — Telehealth: Payer: Self-pay | Admitting: Primary Care

## 2023-12-31 NOTE — Telephone Encounter (Signed)
 Copied from CRM [id number]. Topic: Clinical - Medication Question >> Dec 06, 2023  9:53 AM Gery Pray wrote: Reason for CRM: Destiny from [employer] will be faxing over a Prescription Change Request for patient and would like for the provider to review, deny or approve,  and signed and faxed back to [phone]. >> Dec 31, 2023  1:02 PM Emylou G wrote: Lyn Hollingshead w/[employer] w/[employer] healthplan Insulin Glargine (BASAGLAR KWIKPEN) 100 UNIT/ML calling back.. said patient can be having savings if you want to change.. faxed over request 3/18.Marland Kitchen Pls call [phone] -- to either accept or deny they would like it to be sent back.Marland Kitchen

## 2024-01-03 NOTE — Telephone Encounter (Signed)
 Have you seen any paperwork for this patient?

## 2024-01-07 NOTE — Telephone Encounter (Signed)
 Looked through paperwork and found paperwork will have provider sign for fax

## 2024-01-14 ENCOUNTER — Ambulatory Visit (INDEPENDENT_AMBULATORY_CARE_PROVIDER_SITE_OTHER): Payer: No Typology Code available for payment source | Admitting: Primary Care

## 2024-01-14 ENCOUNTER — Telehealth (INDEPENDENT_AMBULATORY_CARE_PROVIDER_SITE_OTHER): Payer: Self-pay | Admitting: Primary Care

## 2024-01-14 NOTE — Telephone Encounter (Signed)
 Called to see if pt could come in sooner than scheduled appt. Pt did not answer and could not leave VM. Provider will not be in office at time of appt. Please advise.

## 2024-01-18 ENCOUNTER — Other Ambulatory Visit: Payer: Self-pay

## 2024-01-18 ENCOUNTER — Other Ambulatory Visit (INDEPENDENT_AMBULATORY_CARE_PROVIDER_SITE_OTHER): Payer: Self-pay | Admitting: Primary Care

## 2024-01-18 MED ORDER — INSULIN GLARGINE-YFGN 100 UNIT/ML ~~LOC~~ SOPN
15.0000 [IU] | PEN_INJECTOR | Freq: Every day | SUBCUTANEOUS | 3 refills | Status: AC
  Filled 2024-01-18: qty 3, 20d supply, fill #0

## 2024-01-23 ENCOUNTER — Ambulatory Visit: Payer: Self-pay | Admitting: *Deleted
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
## 2024-01-23 NOTE — Telephone Encounter (Signed)
 Pt refuses ED refer to mobile or first available with any provider

## 2024-01-24 NOTE — Telephone Encounter (Signed)
 Noted.

## 2024-04-25 ENCOUNTER — Telehealth (INDEPENDENT_AMBULATORY_CARE_PROVIDER_SITE_OTHER): Payer: Self-pay | Admitting: Primary Care

## 2024-04-25 NOTE — Telephone Encounter (Signed)
 Called pt to confirm appt. Pt did not answer and LVM

## 2024-04-28 ENCOUNTER — Ambulatory Visit (INDEPENDENT_AMBULATORY_CARE_PROVIDER_SITE_OTHER): Admitting: Primary Care

## 2024-04-28 ENCOUNTER — Telehealth (INDEPENDENT_AMBULATORY_CARE_PROVIDER_SITE_OTHER): Payer: Self-pay | Admitting: Primary Care

## 2024-04-28 NOTE — Telephone Encounter (Signed)
 Called pt to reschedule missed appt. Pt did not answer and LVM.

## 2024-05-19 ENCOUNTER — Ambulatory Visit (INDEPENDENT_AMBULATORY_CARE_PROVIDER_SITE_OTHER): Admitting: Primary Care

## 2024-06-19 ENCOUNTER — Ambulatory Visit (INDEPENDENT_AMBULATORY_CARE_PROVIDER_SITE_OTHER): Admitting: Primary Care

## 2024-07-23 ENCOUNTER — Ambulatory Visit (INDEPENDENT_AMBULATORY_CARE_PROVIDER_SITE_OTHER): Admitting: Primary Care

## 2024-08-08 ENCOUNTER — Telehealth (INDEPENDENT_AMBULATORY_CARE_PROVIDER_SITE_OTHER): Payer: Self-pay | Admitting: Primary Care

## 2024-08-08 ENCOUNTER — Ambulatory Visit: Payer: Self-pay

## 2024-08-08 NOTE — Telephone Encounter (Signed)
 Call dropped after being warm transferred to this RN. Attempted to call back, went to voicemail. Voicemail full and unable to leave message.  Copied from CRM [id number]. Topic: Clinical - Red Word Triage >> Aug 08, 2024  4:22 PM Delon DASEN wrote: Red Word that prompted transfer to Nurse Triage: weakness for the past 2-3 days, out of insulin  and needles for the past week, runny nose, feels feverish

## 2024-08-08 NOTE — Telephone Encounter (Signed)
 FYI Only or Action Required?: Action required by provider: request for appointment, update on patient condition, and request refill of insulin  and needles.  Patient was last seen in primary care on 10/16/2023 by Celestia Rosaline SQUIBB, NP.  Called Nurse Triage reporting Fatigue and Insulin  Issue.  Symptoms began several days ago.  Interventions attempted: Other: using others insulin .  Symptoms are: gradually worsening.  Triage Disposition: See HCP Within 4 Hours (Or PCP Triage)  Patient/caregiver understands and will follow disposition?: No, wishes to speak with PCP   Reason for Disposition  [1] MODERATE weakness (e.g., interferes with work, school, normal activities) AND [2] cause unknown  (Exceptions: Weakness from acute minor illness or poor fluid intake; weakness is chronic and not worse.)  Answer Assessment - Initial Assessment Questions Additional info:  1) Need insulin  and needle refills. Taking other peoples insulin .  2) Attempted to schedule patient but received warning that patient is on walk-in basis only. Called to CAL they explained that due to multiple no shows patient is Walk in only status and that mean he can come to the office Mon-Thur between 8am-4pm and wait to see if a patient No show and then he will be worked in. CAL offered to take caller to explain situation. Caller transferred to CAL for further assistance.    1. DESCRIPTION: Describe how you are feeling.    Fatigue increasing for 2-3 days.  2. SEVERITY: How bad is it?  Can you stand and walk?     Increasing ambulating fine 3. ONSET: When did these symptoms begin? (e.g., hours, days, weeks, months)     2-3 days  4. CAUSE: What do you think is causing the weakness or fatigue? (e.g., not drinking enough fluids, medical problem, trouble sleeping)     Fatigue  5. NEW MEDICINES:  Have you started on any new medicines recently? (e.g., opioid pain medicines, benzodiazepines, muscle relaxants,  antidepressants, antihistamines, neuroleptics, beta blockers)      6. OTHER SYMPTOMS: Do you have any other symptoms? (e.g., chest pain, fever, cough, SOB, vomiting, diarrhea, bleeding, other areas of pain)     Runny nose, feels feverish. Has not checked blood sugar recently.  Protocols used: Weakness (Generalized) and Fatigue-A-AH

## 2024-08-08 NOTE — Telephone Encounter (Signed)
 In response to CRM. [facility] called wanting to schedule appt. I asked agent to bring pt in and I wanted to explain to the pt that he is on walk in basis only. Pt understood and stated that he had been taking other peoples insulin  and that he thinks he is his own doctor. I explained that he would have to come in and sit and wait for a no show and we can work him in then.
